# Patient Record
Sex: Male | Born: 1960 | Race: White | Hispanic: No | Marital: Married | Smoking: Never smoker
Health system: Southern US, Community
[De-identification: ages and names within clinical notes are randomized; demographics above are authoritative.]

## PROBLEM LIST (undated history)

## (undated) DIAGNOSIS — I517 Cardiomegaly: Secondary | ICD-10-CM

## (undated) DIAGNOSIS — M5126 Other intervertebral disc displacement, lumbar region: Secondary | ICD-10-CM

## (undated) DIAGNOSIS — Q231 Congenital insufficiency of aortic valve: Secondary | ICD-10-CM

## (undated) DIAGNOSIS — Q2381 Bicuspid aortic valve: Secondary | ICD-10-CM

## (undated) DIAGNOSIS — Z952 Presence of prosthetic heart valve: Secondary | ICD-10-CM

## (undated) DIAGNOSIS — I10 Essential (primary) hypertension: Secondary | ICD-10-CM

## (undated) DIAGNOSIS — E119 Type 2 diabetes mellitus without complications: Secondary | ICD-10-CM

## (undated) DIAGNOSIS — E785 Hyperlipidemia, unspecified: Secondary | ICD-10-CM

## (undated) DIAGNOSIS — M51369 Other intervertebral disc degeneration, lumbar region without mention of lumbar back pain or lower extremity pain: Secondary | ICD-10-CM

## (undated) DIAGNOSIS — I350 Nonrheumatic aortic (valve) stenosis: Secondary | ICD-10-CM

## (undated) DIAGNOSIS — I77810 Thoracic aortic ectasia: Secondary | ICD-10-CM

## (undated) DIAGNOSIS — K219 Gastro-esophageal reflux disease without esophagitis: Secondary | ICD-10-CM

## (undated) DIAGNOSIS — I2510 Atherosclerotic heart disease of native coronary artery without angina pectoris: Secondary | ICD-10-CM

## (undated) DIAGNOSIS — Z7982 Long term (current) use of aspirin: Secondary | ICD-10-CM

## (undated) DIAGNOSIS — I2089 Other forms of angina pectoris: Secondary | ICD-10-CM

## (undated) HISTORY — DX: Atherosclerotic heart disease of native coronary artery without angina pectoris: I25.10

## (undated) HISTORY — DX: Bicuspid aortic valve: Q23.81

## (undated) HISTORY — DX: Congenital insufficiency of aortic valve: Q23.1

## (undated) HISTORY — PX: OTHER SURGICAL HISTORY: SHX169

## (undated) HISTORY — PX: SHOULDER SURGERY: SHX246

## (undated) HISTORY — PX: NECK SURGERY: SHX720

## (undated) HISTORY — DX: Other forms of angina pectoris: I20.89

## (undated) HISTORY — DX: Hyperlipidemia, unspecified: E78.5

## (undated) HISTORY — DX: Cardiomegaly: I51.7

## (undated) HISTORY — DX: Nonrheumatic aortic (valve) stenosis: I35.0

## (undated) HISTORY — PX: COLONOSCOPY: SHX174

---

## 2000-11-26 ENCOUNTER — Encounter: Payer: Self-pay | Admitting: Neurosurgery

## 2000-11-30 ENCOUNTER — Encounter: Payer: Self-pay | Admitting: Neurosurgery

## 2000-11-30 ENCOUNTER — Ambulatory Visit (HOSPITAL_COMMUNITY): Admission: RE | Admit: 2000-11-30 | Discharge: 2000-12-01 | Payer: Self-pay | Admitting: Neurosurgery

## 2006-10-25 ENCOUNTER — Ambulatory Visit: Payer: Self-pay | Admitting: Urology

## 2008-03-04 ENCOUNTER — Ambulatory Visit: Payer: Self-pay | Admitting: Family Medicine

## 2008-03-05 ENCOUNTER — Ambulatory Visit: Payer: Self-pay | Admitting: Orthopedic Surgery

## 2008-11-15 ENCOUNTER — Emergency Department: Payer: Self-pay | Admitting: Emergency Medicine

## 2008-11-19 ENCOUNTER — Ambulatory Visit: Payer: Self-pay | Admitting: Urology

## 2008-11-26 ENCOUNTER — Ambulatory Visit: Payer: Self-pay | Admitting: Urology

## 2010-12-08 ENCOUNTER — Ambulatory Visit: Payer: Self-pay | Admitting: Gastroenterology

## 2011-06-11 IMAGING — CT CT STONE STUDY
1 of 2 series · 15 of 32 positions shown, 19 images · non-contrast
Comparison: none

REASON FOR EXAM: pain
COMMENTS:

[Series 2: stone · axial · 0.71mm/px · z∈[-398,+42]mm · 15 of 161 slices shown, 19 images]
[im 7/161  soft-tissue]
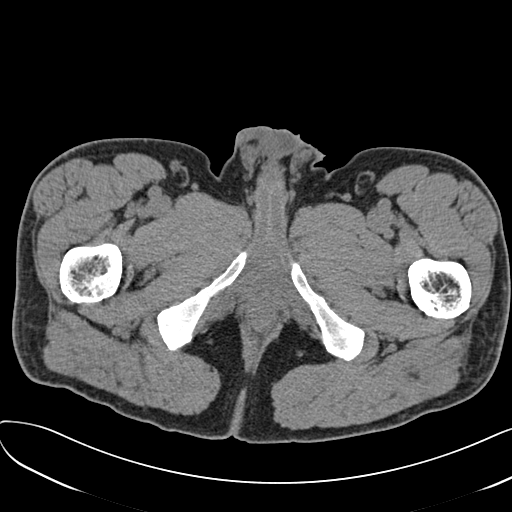
[im 7/161  bone]
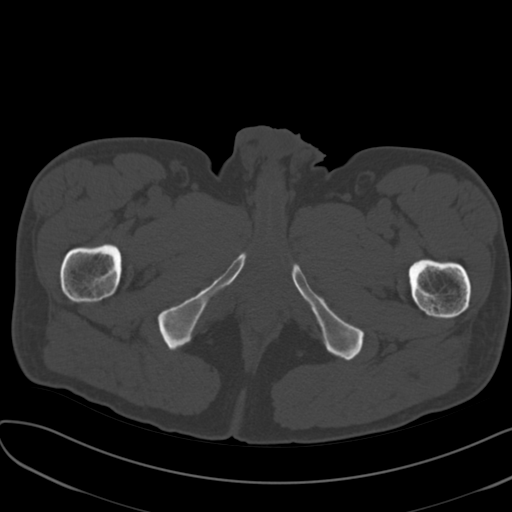
[im 19/161  soft-tissue]
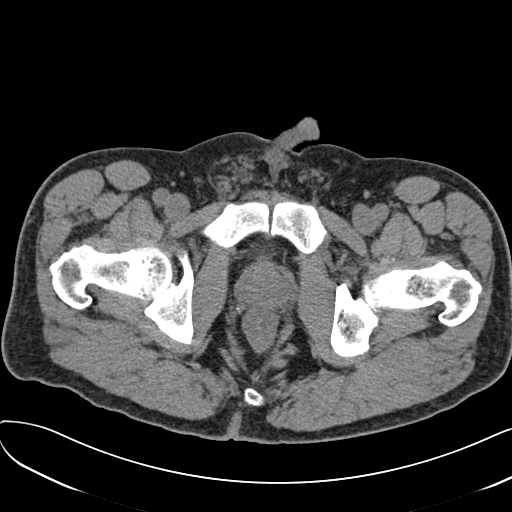
[im 31/161  soft-tissue]
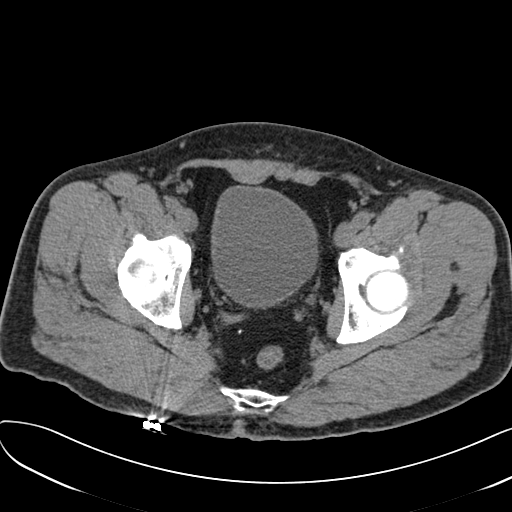
[im 44/161  soft-tissue]
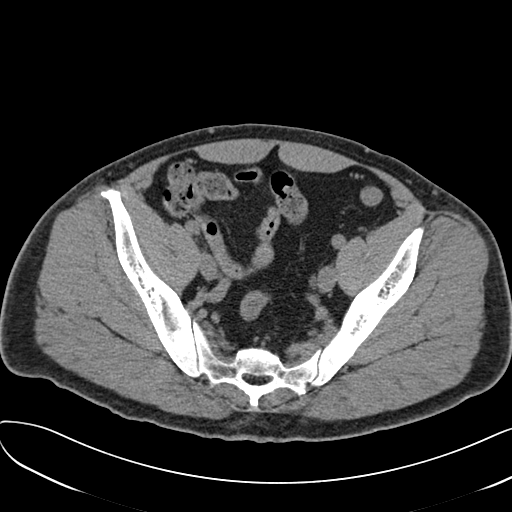
[im 56/161  soft-tissue]
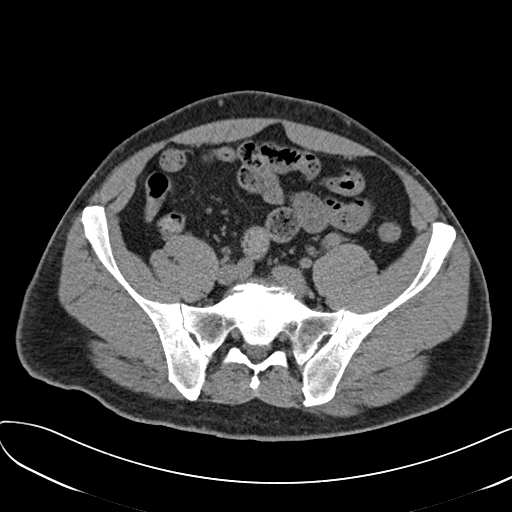
[im 68/161  soft-tissue]
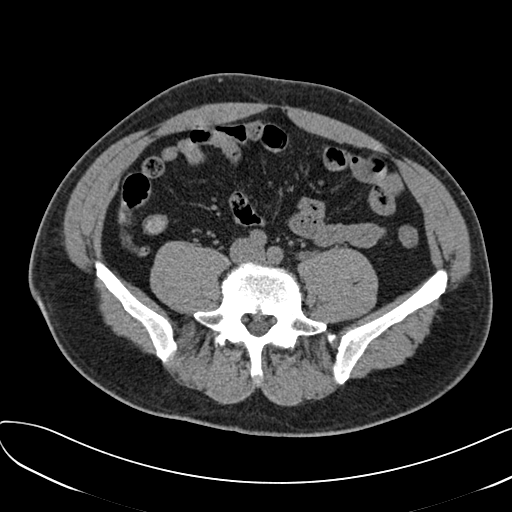
[im 81/161  soft-tissue]
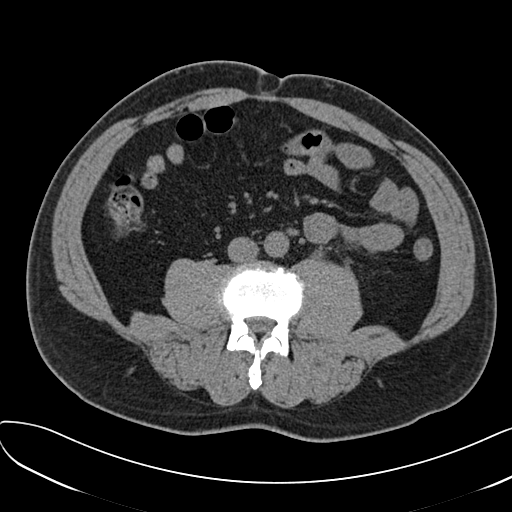
[im 93/161  soft-tissue]
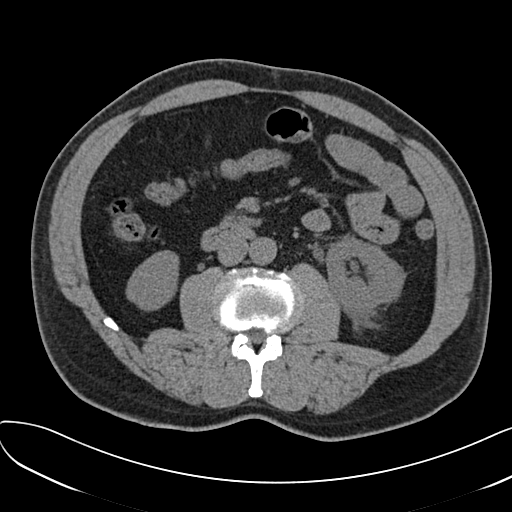
[im 105/161  soft-tissue]
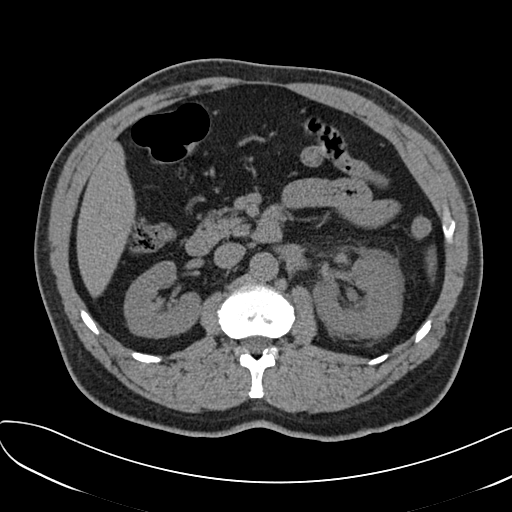
[im 105/161  bone]
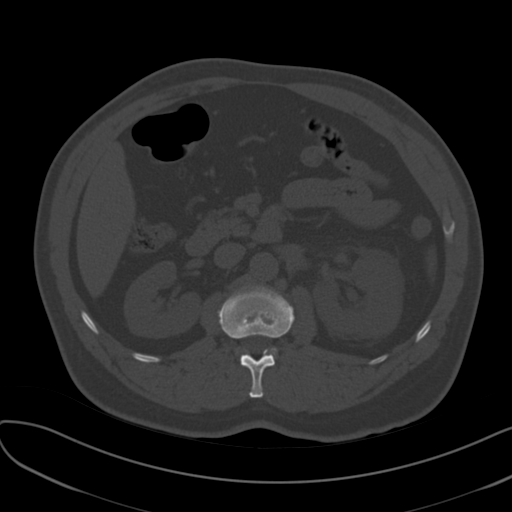
[im 117/161  soft-tissue]
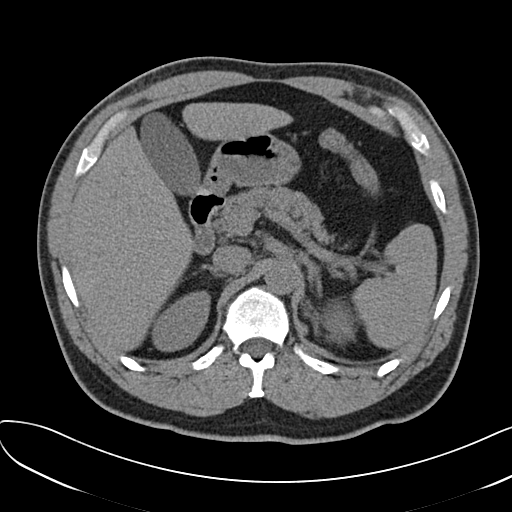
[im 130/161  soft-tissue]
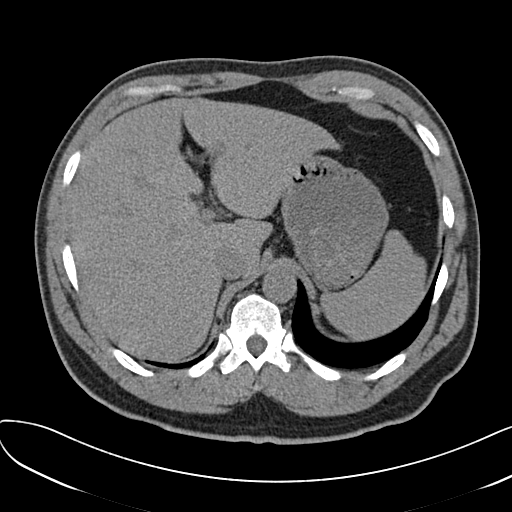
[im 136/161  lung]
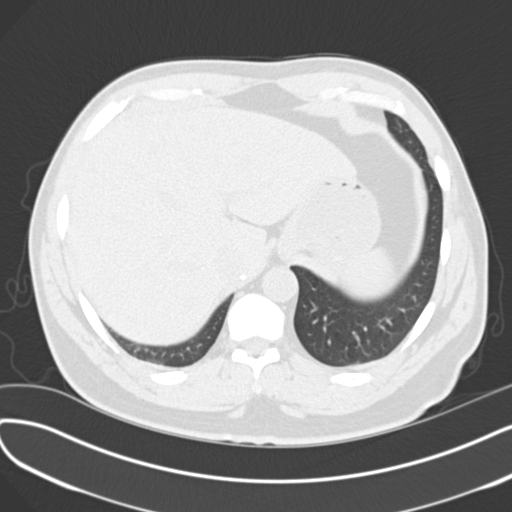
[im 142/161  soft-tissue]
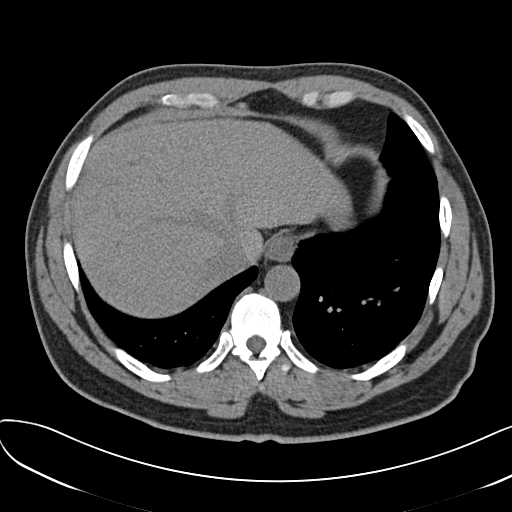
[im 142/161  lung]
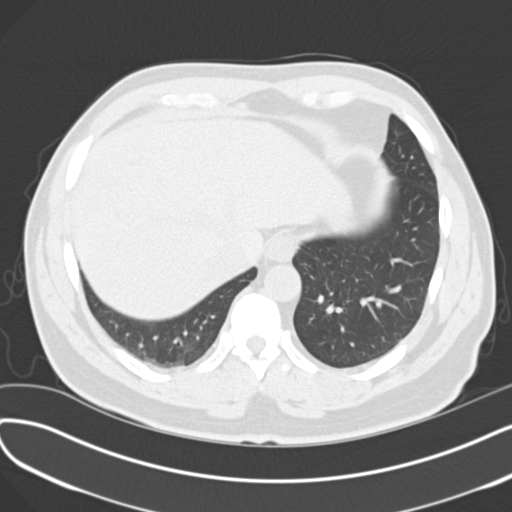
[im 148/161  lung]
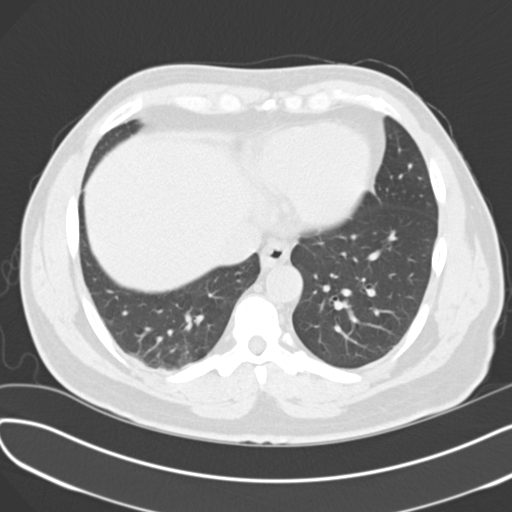
[im 154/161  soft-tissue]
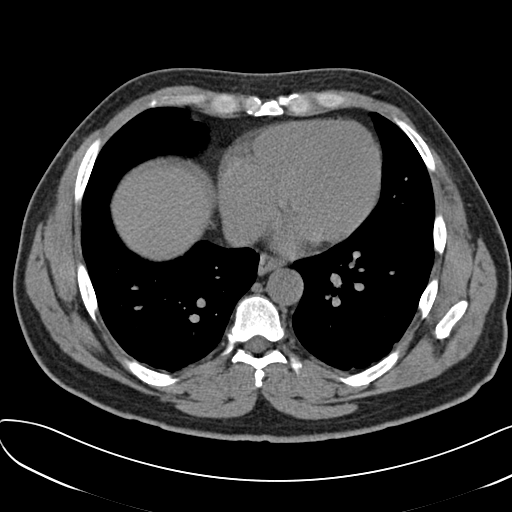
[im 154/161  lung]
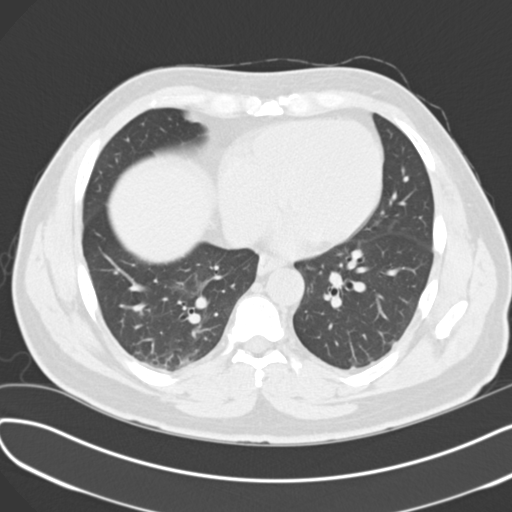

[15 of 32 positions shown; findings below may reference images not displayed]

PROCEDURE:     CT  - CT ABDOMEN /PELVIS WO (STONE)  - November 15, 2008  [DATE]

RESULT:     Axial noncontrast CT scanning was performed through the abdomen
and pelvis at 3 mm intervals and slice thicknesses. Review of 3-dimensional
reconstructed images was performed separately on the [employer] monitor.

There is mild hydronephrosis and hydroureter on the left secondary to an
approximately 3 mm diameter stone that lies approximately 5 mm from the
ureterovesical junction. I do not see evidence of other stones in the left
ureter. There is a nonobstructing 2 mm diameter lower correction mid pole
stone on the left. The right kidney exhibits no evidence of stones.

The liver, gallbladder, stomach, spleen, adrenal glands, pancreas, and
abdominal aorta are normal in appearance. There is no free fluid in the
abdomen or pelvis. The unopacified loops of small and large bowel appear
normal. The appendix is identified and is normal. The prostate gland and
seminal vesicles appear normal for age. There is a tiny amount of air within
the anterior aspect of the urinary bladder on image 127. There are
degenerative changes of the lumbar spine The lung bases exhibit no acute
abnormality.
IMPRESSION: 1. There is mild hydronephrosis and hydroureter on the left secondary to an
approximately 4 mm diameter distal left ureteral stone lying just proximal
to the ureterovesical junction. There is a nonobstructing 2 mm diameter
midpole stone on the left as well. I see no stones in the right kidney or
right ureter. The urinary bladder contains a tiny amount of air anteriorly
on image 127. Correlation with the patient's urinalysis is needed . This air
could be due to infection but also could be related to recent
catheterization of the bladder if any.
2. I do not see acute abnormality elsewhere within the abdomen or pelvis.

A preliminary report was sent to the [HOSPITAL] the conclusion
of the study.

## 2011-06-15 IMAGING — CR DG ABDOMEN 1V
1 series · 2 of 2 positions shown · non-contrast
Comparison: none

REASON FOR EXAM: kidney stone  send films with pt
COMMENTS:

[Series 1: view not recorded · 0.17mm/px · 2 of 2 slices shown]
[im 1/2]
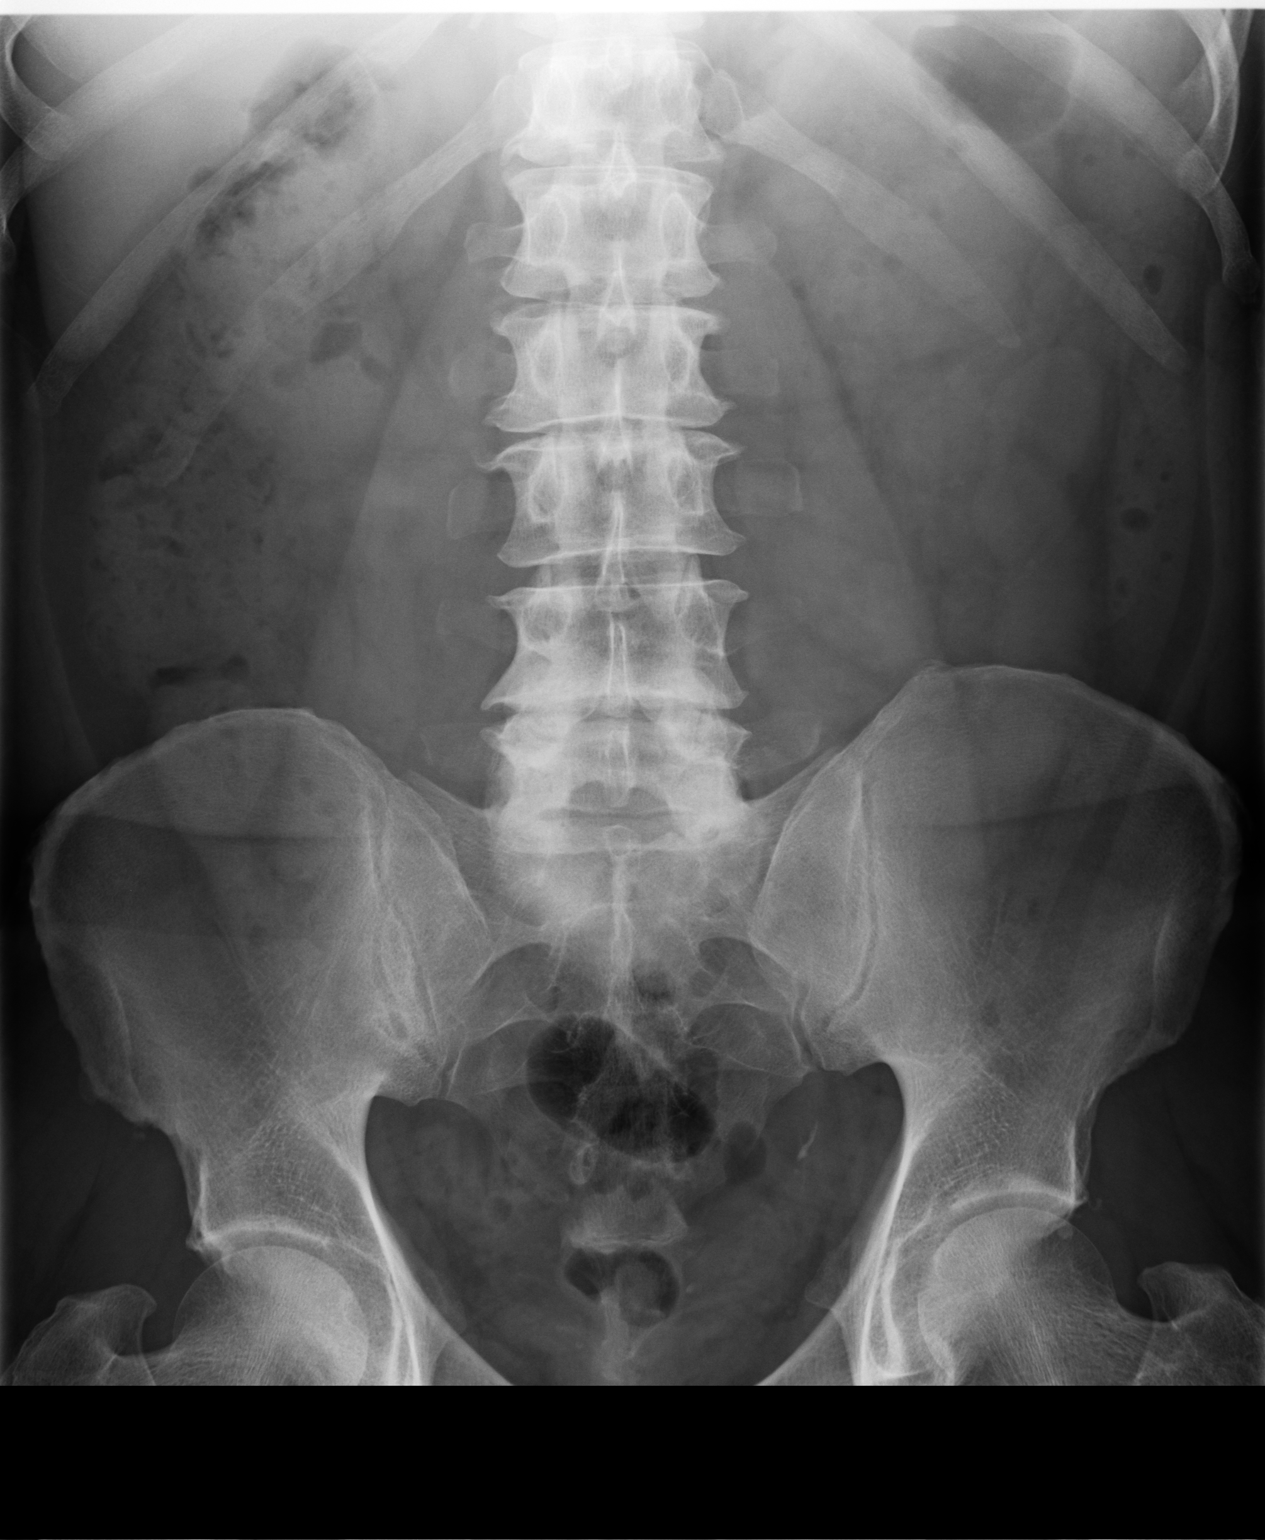
[im 2/2]
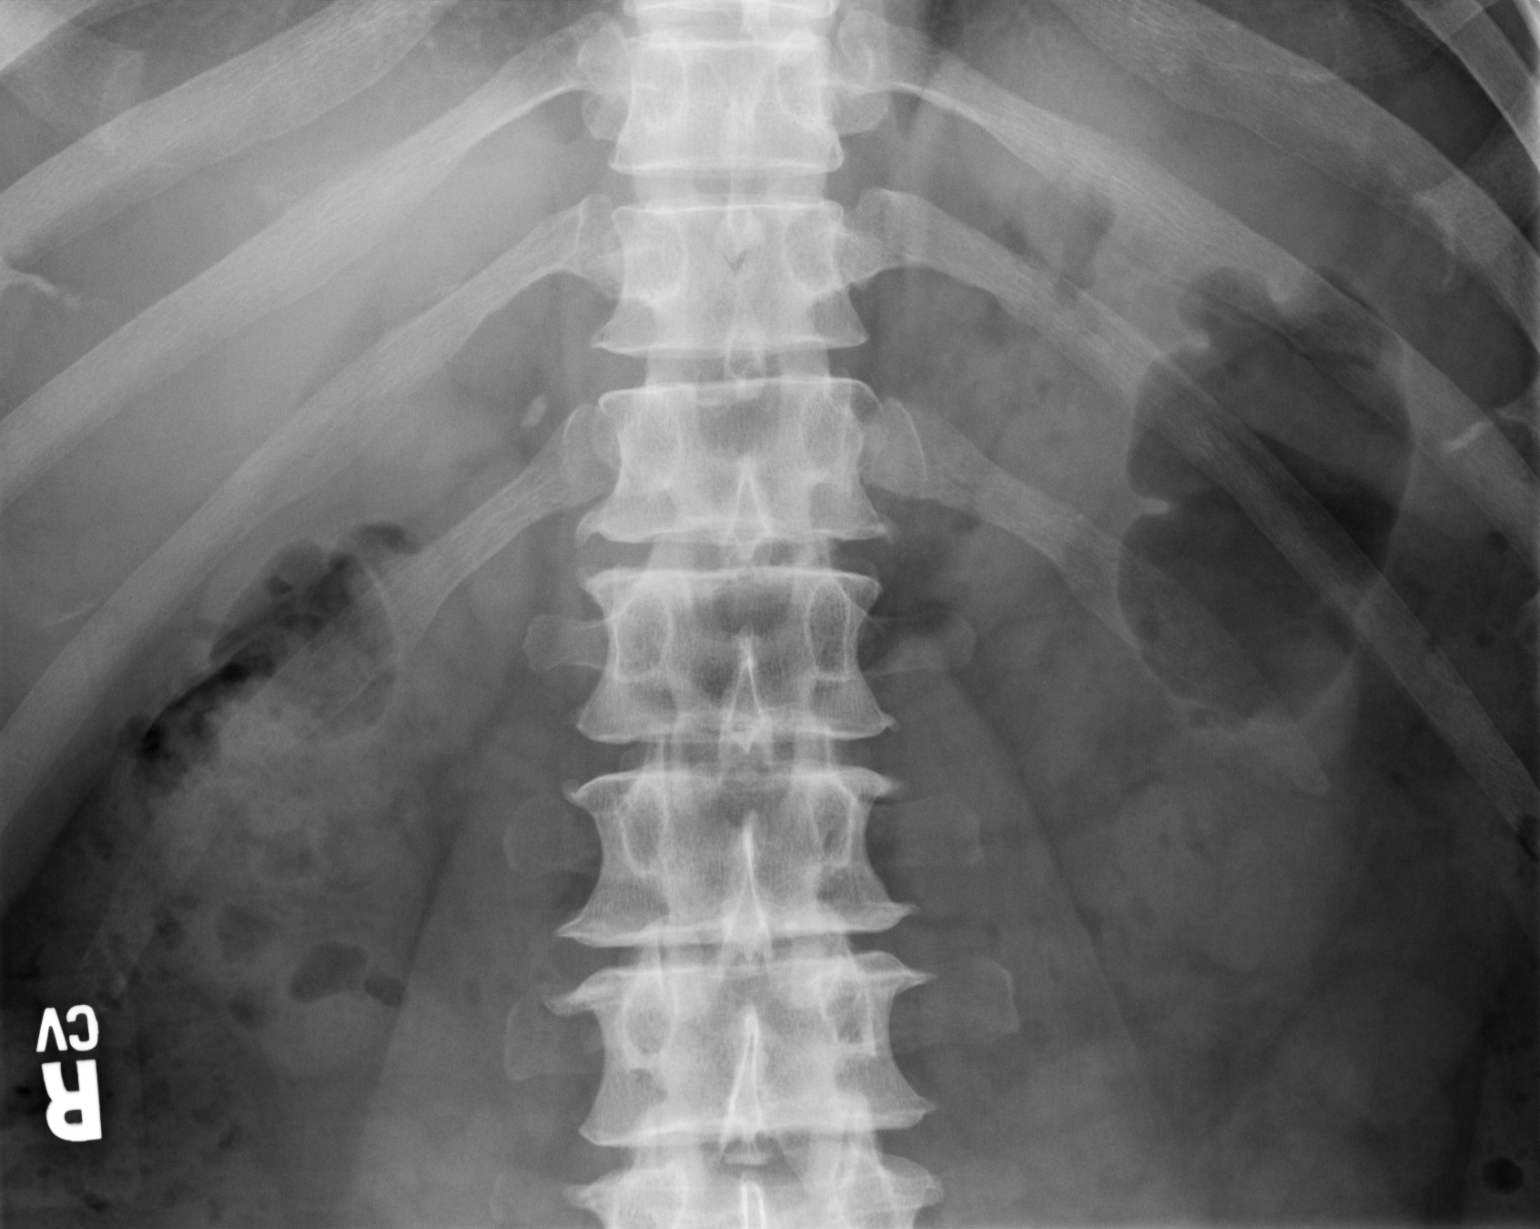

[2 of 2 positions shown; findings below may reference images not displayed]

PROCEDURE:     MDR - MDR KIDNEY URETER BLADDER  - November 19, 2008 [DATE]

RESULT:     The bowel gas pattern is within the limits of normal. There is
linear density noted in the left aspect of the true bony pelvis which
appears stable. The faint calcifications demonstrated previously over the
left kidney are not evident today. I see no calcified stones projecting over
the right kidney. Mild degenerative disc change of the lumbar spine is
present.
IMPRESSION: The previously demonstrated calcification projecting over
the midpole of the left kidney is not seen today. There is stable linear
density in the left aspect of the true bony pelvis. Further interpretation
is deferred to Dr. Moatshe.

## 2014-05-25 DIAGNOSIS — K219 Gastro-esophageal reflux disease without esophagitis: Secondary | ICD-10-CM | POA: Insufficient documentation

## 2016-11-18 DIAGNOSIS — H2511 Age-related nuclear cataract, right eye: Secondary | ICD-10-CM | POA: Diagnosis not present

## 2016-11-18 DIAGNOSIS — H2510 Age-related nuclear cataract, unspecified eye: Secondary | ICD-10-CM | POA: Diagnosis not present

## 2016-11-18 DIAGNOSIS — H52221 Regular astigmatism, right eye: Secondary | ICD-10-CM | POA: Diagnosis not present

## 2016-12-02 DIAGNOSIS — H52222 Regular astigmatism, left eye: Secondary | ICD-10-CM | POA: Diagnosis not present

## 2016-12-02 DIAGNOSIS — H2510 Age-related nuclear cataract, unspecified eye: Secondary | ICD-10-CM | POA: Diagnosis not present

## 2016-12-02 DIAGNOSIS — H2512 Age-related nuclear cataract, left eye: Secondary | ICD-10-CM | POA: Diagnosis not present

## 2017-01-27 DIAGNOSIS — E782 Mixed hyperlipidemia: Secondary | ICD-10-CM | POA: Diagnosis not present

## 2017-02-08 DIAGNOSIS — E785 Hyperlipidemia, unspecified: Secondary | ICD-10-CM | POA: Diagnosis not present

## 2017-02-08 DIAGNOSIS — M109 Gout, unspecified: Secondary | ICD-10-CM | POA: Diagnosis not present

## 2017-02-08 DIAGNOSIS — I10 Essential (primary) hypertension: Secondary | ICD-10-CM | POA: Diagnosis not present

## 2017-04-19 DIAGNOSIS — M109 Gout, unspecified: Secondary | ICD-10-CM | POA: Diagnosis not present

## 2017-04-19 DIAGNOSIS — E785 Hyperlipidemia, unspecified: Secondary | ICD-10-CM | POA: Diagnosis not present

## 2017-04-19 DIAGNOSIS — I10 Essential (primary) hypertension: Secondary | ICD-10-CM | POA: Diagnosis not present

## 2017-08-09 DIAGNOSIS — E785 Hyperlipidemia, unspecified: Secondary | ICD-10-CM | POA: Diagnosis not present

## 2017-08-09 DIAGNOSIS — Z0000 Encounter for general adult medical examination without abnormal findings: Secondary | ICD-10-CM | POA: Diagnosis not present

## 2017-08-09 DIAGNOSIS — I10 Essential (primary) hypertension: Secondary | ICD-10-CM | POA: Diagnosis not present

## 2017-09-22 DIAGNOSIS — R29898 Other symptoms and signs involving the musculoskeletal system: Secondary | ICD-10-CM | POA: Diagnosis not present

## 2018-02-11 DIAGNOSIS — K219 Gastro-esophageal reflux disease without esophagitis: Secondary | ICD-10-CM | POA: Diagnosis not present

## 2018-02-11 DIAGNOSIS — I10 Essential (primary) hypertension: Secondary | ICD-10-CM | POA: Diagnosis not present

## 2018-02-11 DIAGNOSIS — M109 Gout, unspecified: Secondary | ICD-10-CM | POA: Diagnosis not present

## 2018-02-25 DIAGNOSIS — M109 Gout, unspecified: Secondary | ICD-10-CM | POA: Diagnosis not present

## 2018-02-25 DIAGNOSIS — E785 Hyperlipidemia, unspecified: Secondary | ICD-10-CM | POA: Diagnosis not present

## 2018-02-25 DIAGNOSIS — I10 Essential (primary) hypertension: Secondary | ICD-10-CM | POA: Diagnosis not present

## 2018-03-16 DIAGNOSIS — I208 Other forms of angina pectoris: Secondary | ICD-10-CM | POA: Diagnosis not present

## 2018-03-16 DIAGNOSIS — I10 Essential (primary) hypertension: Secondary | ICD-10-CM | POA: Diagnosis not present

## 2018-04-06 DIAGNOSIS — I208 Other forms of angina pectoris: Secondary | ICD-10-CM | POA: Diagnosis not present

## 2018-04-21 DIAGNOSIS — I119 Hypertensive heart disease without heart failure: Secondary | ICD-10-CM | POA: Insufficient documentation

## 2018-04-21 DIAGNOSIS — I350 Nonrheumatic aortic (valve) stenosis: Secondary | ICD-10-CM | POA: Diagnosis not present

## 2018-04-21 DIAGNOSIS — I10 Essential (primary) hypertension: Secondary | ICD-10-CM | POA: Diagnosis not present

## 2018-04-21 DIAGNOSIS — E7801 Familial hypercholesterolemia: Secondary | ICD-10-CM | POA: Diagnosis not present

## 2018-06-29 DIAGNOSIS — I209 Angina pectoris, unspecified: Secondary | ICD-10-CM | POA: Diagnosis present

## 2018-07-01 ENCOUNTER — Other Ambulatory Visit
Admission: RE | Admit: 2018-07-01 | Discharge: 2018-07-01 | Disposition: A | Discharge status: Home / Self Care | Payer: 59 | Source: Ambulatory Visit | Attending: Internal Medicine | Admitting: Internal Medicine

## 2018-07-01 ENCOUNTER — Other Ambulatory Visit: Payer: Self-pay

## 2018-07-01 DIAGNOSIS — Z1159 Encounter for screening for other viral diseases: Secondary | ICD-10-CM | POA: Insufficient documentation

## 2018-07-02 LAB — NOVEL CORONAVIRUS, NAA (HOSP ORDER, SEND-OUT TO REF LAB; TAT 18-24 HRS): SARS-CoV-2, NAA: NOT DETECTED

## 2018-07-07 ENCOUNTER — Other Ambulatory Visit: Payer: Self-pay

## 2018-07-07 ENCOUNTER — Observation Stay
Admission: RE | Admit: 2018-07-07 | Discharge: 2018-07-08 | Disposition: A | Discharge status: Home / Self Care | Payer: 59 | Attending: Cardiology | Admitting: Cardiology

## 2018-07-07 ENCOUNTER — Encounter
Admission: RE | Disposition: A | Discharge status: Home / Self Care | Payer: Self-pay | Source: Home / Self Care | Attending: Cardiology

## 2018-07-07 DIAGNOSIS — Z79899 Other long term (current) drug therapy: Secondary | ICD-10-CM | POA: Insufficient documentation

## 2018-07-07 DIAGNOSIS — I209 Angina pectoris, unspecified: Secondary | ICD-10-CM | POA: Diagnosis present

## 2018-07-07 DIAGNOSIS — Z7982 Long term (current) use of aspirin: Secondary | ICD-10-CM | POA: Insufficient documentation

## 2018-07-07 DIAGNOSIS — I25119 Atherosclerotic heart disease of native coronary artery with unspecified angina pectoris: Principal | ICD-10-CM | POA: Insufficient documentation

## 2018-07-07 DIAGNOSIS — M109 Gout, unspecified: Secondary | ICD-10-CM

## 2018-07-07 DIAGNOSIS — I2510 Atherosclerotic heart disease of native coronary artery without angina pectoris: Secondary | ICD-10-CM | POA: Diagnosis present

## 2018-07-07 DIAGNOSIS — I10 Essential (primary) hypertension: Secondary | ICD-10-CM | POA: Insufficient documentation

## 2018-07-07 DIAGNOSIS — I350 Nonrheumatic aortic (valve) stenosis: Secondary | ICD-10-CM | POA: Insufficient documentation

## 2018-07-07 DIAGNOSIS — Z955 Presence of coronary angioplasty implant and graft: Secondary | ICD-10-CM | POA: Diagnosis not present

## 2018-07-07 DIAGNOSIS — Z791 Long term (current) use of non-steroidal anti-inflammatories (NSAID): Secondary | ICD-10-CM | POA: Insufficient documentation

## 2018-07-07 DIAGNOSIS — E782 Mixed hyperlipidemia: Secondary | ICD-10-CM | POA: Diagnosis not present

## 2018-07-07 DIAGNOSIS — R0789 Other chest pain: Secondary | ICD-10-CM

## 2018-07-07 HISTORY — PX: CORONARY STENT INTERVENTION: CATH118234

## 2018-07-07 HISTORY — DX: Gout, unspecified: M10.9

## 2018-07-07 HISTORY — PX: RIGHT/LEFT HEART CATH AND CORONARY ANGIOGRAPHY: CATH118266

## 2018-07-07 LAB — POCT ACTIVATED CLOTTING TIME: Activated Clotting Time: 368 seconds

## 2018-07-07 SURGERY — RIGHT/LEFT HEART CATH AND CORONARY ANGIOGRAPHY
Anesthesia: Moderate Sedation

## 2018-07-07 MED ORDER — LIDOCAINE HCL (PF) 1 % IJ SOLN
INTRAMUSCULAR | Status: AC
  Filled 2018-07-07: qty 30

## 2018-07-07 MED ORDER — SODIUM CHLORIDE 0.9 % WEIGHT BASED INFUSION
1.0000 mL/kg/h | INTRAVENOUS | Status: AC
  Administered 2018-07-07: 1 mL/kg/h via INTRAVENOUS

## 2018-07-07 MED ORDER — FENTANYL CITRATE (PF) 100 MCG/2ML IJ SOLN
INTRAMUSCULAR | Status: DC | PRN
  Administered 2018-07-07 (×2): 25 ug via INTRAVENOUS

## 2018-07-07 MED ORDER — HEPARIN (PORCINE) IN NACL 1000-0.9 UT/500ML-% IV SOLN
INTRAVENOUS | Status: AC
  Filled 2018-07-07: qty 1000

## 2018-07-07 MED ORDER — SODIUM CHLORIDE 0.9 % WEIGHT BASED INFUSION: 1.0000 mL/kg/h | INTRAVENOUS | Status: DC

## 2018-07-07 MED ORDER — SODIUM CHLORIDE 0.9 % WEIGHT BASED INFUSION
3.0000 mL/kg/h | INTRAVENOUS | Status: AC
  Administered 2018-07-07: 07:00:00 3 mL/kg/h via INTRAVENOUS

## 2018-07-07 MED ORDER — MIDAZOLAM HCL 2 MG/2ML IJ SOLN
INTRAMUSCULAR | Status: AC
  Filled 2018-07-07: qty 2

## 2018-07-07 MED ORDER — MIDAZOLAM HCL 2 MG/2ML IJ SOLN
INTRAMUSCULAR | Status: DC | PRN
  Administered 2018-07-07 (×2): 1 mg via INTRAVENOUS

## 2018-07-07 MED ORDER — ATROPINE SULFATE 1 MG/10ML IJ SOSY
PREFILLED_SYRINGE | INTRAMUSCULAR | Status: DC | PRN
  Administered 2018-07-07: 1 mg via INTRAVENOUS

## 2018-07-07 MED ORDER — BIVALIRUDIN TRIFLUOROACETATE 250 MG IV SOLR
INTRAVENOUS | Status: AC
  Filled 2018-07-07: qty 250

## 2018-07-07 MED ORDER — HYDROCHLOROTHIAZIDE 25 MG PO TABS
25.0000 mg | ORAL_TABLET | Freq: Every day | ORAL | Status: DC
  Administered 2018-07-08: 25 mg via ORAL
  Filled 2018-07-07 (×2): qty 1

## 2018-07-07 MED ORDER — ACETAMINOPHEN 325 MG PO TABS: 650.0000 mg | ORAL_TABLET | ORAL | Status: DC | PRN

## 2018-07-07 MED ORDER — SODIUM CHLORIDE 0.9 % IV SOLN: 250.0000 mL | INTRAVENOUS | Status: DC | PRN

## 2018-07-07 MED ORDER — ONDANSETRON HCL 4 MG/2ML IJ SOLN: 4.0000 mg | Freq: Four times a day (QID) | INTRAMUSCULAR | Status: DC | PRN

## 2018-07-07 MED ORDER — FENTANYL CITRATE (PF) 100 MCG/2ML IJ SOLN
INTRAMUSCULAR | Status: AC
  Filled 2018-07-07: qty 2

## 2018-07-07 MED ORDER — SODIUM CHLORIDE 0.9 % IV SOLN
INTRAVENOUS | Status: AC | PRN
  Administered 2018-07-07 (×2): 1.75 mg/kg/h via INTRAVENOUS

## 2018-07-07 MED ORDER — ASPIRIN 81 MG PO CHEW
81.0000 mg | CHEWABLE_TABLET | Freq: Every day | ORAL | Status: DC
  Administered 2018-07-08: 10:00:00 81 mg via ORAL
  Filled 2018-07-07: qty 1

## 2018-07-07 MED ORDER — HYDRALAZINE HCL 20 MG/ML IJ SOLN: 10.0000 mg | INTRAMUSCULAR | Status: AC | PRN

## 2018-07-07 MED ORDER — ATROPINE SULFATE 1 MG/10ML IJ SOSY
PREFILLED_SYRINGE | INTRAMUSCULAR | Status: AC
  Filled 2018-07-07: qty 10

## 2018-07-07 MED ORDER — LABETALOL HCL 5 MG/ML IV SOLN: 10.0000 mg | INTRAVENOUS | Status: AC | PRN

## 2018-07-07 MED ORDER — SODIUM CHLORIDE 0.9% FLUSH
3.0000 mL | Freq: Two times a day (BID) | INTRAVENOUS | Status: DC
  Administered 2018-07-07: 3 mL via INTRAVENOUS

## 2018-07-07 MED ORDER — ASPIRIN 81 MG PO CHEW: 81.0000 mg | CHEWABLE_TABLET | ORAL | Status: DC

## 2018-07-07 MED ORDER — NITROGLYCERIN 1 MG/10 ML FOR IR/CATH LAB
INTRA_ARTERIAL | Status: AC
  Filled 2018-07-07: qty 10

## 2018-07-07 MED ORDER — TICAGRELOR 90 MG PO TABS
ORAL_TABLET | ORAL | Status: AC
  Filled 2018-07-07: qty 2

## 2018-07-07 MED ORDER — IOHEXOL 300 MG/ML  SOLN
INTRAMUSCULAR | Status: DC | PRN
  Administered 2018-07-07: 75 mL via INTRA_ARTERIAL

## 2018-07-07 MED ORDER — SODIUM CHLORIDE 0.9% FLUSH: 3.0000 mL | INTRAVENOUS | Status: DC | PRN

## 2018-07-07 MED ORDER — TICAGRELOR 90 MG PO TABS
ORAL_TABLET | ORAL | Status: DC | PRN
  Administered 2018-07-07: 180 mg via ORAL

## 2018-07-07 MED ORDER — TICAGRELOR 90 MG PO TABS
90.0000 mg | ORAL_TABLET | Freq: Two times a day (BID) | ORAL | Status: DC
  Administered 2018-07-07 – 2018-07-08 (×2): 90 mg via ORAL
  Filled 2018-07-07 (×2): qty 1

## 2018-07-07 MED ORDER — ASPIRIN 81 MG PO CHEW
CHEWABLE_TABLET | ORAL | Status: DC | PRN
  Administered 2018-07-07: 324 mg via ORAL

## 2018-07-07 MED ORDER — NITROGLYCERIN 1 MG/10 ML FOR IR/CATH LAB
INTRA_ARTERIAL | Status: DC | PRN
  Administered 2018-07-07 (×2): 200 ug via INTRACORONARY

## 2018-07-07 MED ORDER — LISINOPRIL 20 MG PO TABS
20.0000 mg | ORAL_TABLET | Freq: Every day | ORAL | Status: DC
  Administered 2018-07-08: 20 mg via ORAL
  Filled 2018-07-07 (×2): qty 1

## 2018-07-07 MED ORDER — ATORVASTATIN CALCIUM 20 MG PO TABS: 40.0000 mg | ORAL_TABLET | Freq: Every day | ORAL | Status: DC

## 2018-07-07 MED ORDER — HEPARIN (PORCINE) IN NACL 1000-0.9 UT/500ML-% IV SOLN
INTRAVENOUS | Status: DC | PRN
  Administered 2018-07-07 (×2): 500 mL

## 2018-07-07 MED ORDER — ASPIRIN 81 MG PO CHEW
CHEWABLE_TABLET | ORAL | Status: AC
  Filled 2018-07-07: qty 4

## 2018-07-07 MED ORDER — ISOSORBIDE MONONITRATE ER 30 MG PO TB24
30.0000 mg | ORAL_TABLET | Freq: Every day | ORAL | Status: DC
  Administered 2018-07-08: 30 mg via ORAL
  Filled 2018-07-07 (×2): qty 1

## 2018-07-07 MED ORDER — BIVALIRUDIN BOLUS VIA INFUSION - CUPID
INTRAVENOUS | Status: DC | PRN
  Administered 2018-07-07: 61.95 mg via INTRAVENOUS

## 2018-07-07 MED ORDER — IOHEXOL 300 MG/ML  SOLN
INTRAMUSCULAR | Status: DC | PRN
  Administered 2018-07-07: 200 mL via INTRA_ARTERIAL

## 2018-07-07 SURGICAL SUPPLY — 27 items
BALLN TREK RX 2.25X20 (BALLOONS) ×2
BALLN ~~LOC~~ EUPHORA RX 2.5X20 (BALLOONS) ×2
BALLN ~~LOC~~ TREK RX 2.5X20 (BALLOONS) ×2
BALLOON TREK RX 2.25X20 (BALLOONS) ×1 IMPLANT
BALLOON ~~LOC~~ EUPHORA RX 2.5X20 (BALLOONS) ×1 IMPLANT
BALLOON ~~LOC~~ TREK RX 2.5X20 (BALLOONS) ×1 IMPLANT
CATH INFINITI 5FR JL4 (CATHETERS) ×2 IMPLANT
CATH INFINITI JR4 5F (CATHETERS) ×2 IMPLANT
CATH LANGSTON DUAL LUM PIG 6FR (CATHETERS) ×2 IMPLANT
CATH LAUNCHER 6FR JR4 (CATHETERS) ×2 IMPLANT
CATH SWANZ 7F THERMO (CATHETERS) ×2 IMPLANT
DEVICE CLOSURE MYNXGRIP 6/7F (Vascular Products) ×4 IMPLANT
DEVICE INFLAT 30 PLUS (MISCELLANEOUS) ×2 IMPLANT
GUIDELINER 6F (CATHETERS) ×2 IMPLANT
KIT MANI 3VAL PERCEP (MISCELLANEOUS) ×2 IMPLANT
KIT RIGHT HEART (MISCELLANEOUS) ×2 IMPLANT
NEEDLE PERC 18GX7CM (NEEDLE) ×2 IMPLANT
PACK CARDIAC CATH (CUSTOM PROCEDURE TRAY) ×2 IMPLANT
SHEATH AVANTI 6FR X 11CM (SHEATH) ×2 IMPLANT
SHEATH AVANTI 7FRX11 (SHEATH) ×2 IMPLANT
STENT SYNERGY DES 2.25X32 (Permanent Stent) ×2 IMPLANT
STENT SYNERGY DES 3X8 (Permanent Stent) IMPLANT
WIRE ASAHI PROWATER 180CM (WIRE) ×6 IMPLANT
WIRE EMERALD 3MM-J .035X260CM (WIRE) ×2 IMPLANT
WIRE EMERALD ST .035X150CM (WIRE) ×2 IMPLANT
WIRE G HI TQ BMW 190 (WIRE) ×2 IMPLANT
WIRE GUIDERIGHT .035X150 (WIRE) ×2 IMPLANT

## 2018-07-07 NOTE — Plan of Care (Signed)
Right groin site at Level 0.

## 2018-07-07 NOTE — Progress Notes (Signed)
Cardiac Rehab Navigator/ Exercise Physiologist Note  Angioplasty and Stent booklet given and reviewed with patient. Discussed the definition of CAD. Reviewed the location of CAD and where his stent was placed. Informed patient he  will be given a stent card. Explained the purpose of the stent card. Instructed patient to keep stent card in his wallet.  Patient reports that leading up to his Cath he had chest pain an called Dr. Gwen Pounds. Patient passed a stress test. Pain continued. 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 Patient had a repeat stress test and did not pas, which led to his Cath this morning and Stent placement.   Discussed modifiable risk factors including controlling blood pressure, cholesterol, and blood sugar; following heart healthy diet; maintaining healthy weight; exercise; and smoking cessation, not applicable.  Discussed cardiac medications including rationale for taking, mechanisms of action, and side effects. Stressed the importance of taking medications as prescribed.  Discussed emergency plan for heart attack symptoms. Patient verbalized understanding of need to call 911 and not to drive himself to ER if having cardiac symptoms / chest pain.   Diet of low sodium, low fat, low cholesterol heart healthy. Information on diet provided.   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 Patient would like to find other work.   Smoking Cessation - Patient is a NEVER smoker.   Exercise - Benefits of exercised discussed. Patient reports being active but does not exercise. Patient enjoys yard work and English as a second language teacher. This EP informed patient his cardiologist has referred him to outpatient Cardiac Rehab. An overview of the program was provided. Brochure, informational letter, class and orientation times, and CPT billing codes given to patient. Patient is interested in participating. Patient plans to check with his insurance  company to see what his out-of-pocket expenses will be. Patient informed the Cardiac Rehab department is currently closed due to the COVID-19 pandemic. The Cardiac Rehab dept will contact patient as soon as the department reopens. Patient would benefit from [employer] may have a financial barrier with insurance plan.   Patient appreciative of the information.  Nelson Chimes, BS [place]  [facility] Cardiac & Pulmonary Rehab  Exercise Physiologist Department Phone #: [phone] Fax: [phone]  Direct Line [phone] Email Address: [email or web address]

## 2018-07-08 ENCOUNTER — Encounter: Payer: Self-pay | Admitting: Internal Medicine

## 2018-07-08 DIAGNOSIS — I25119 Atherosclerotic heart disease of native coronary artery with unspecified angina pectoris: Secondary | ICD-10-CM | POA: Diagnosis not present

## 2018-07-08 LAB — CBC
HCT: 41.8 % (ref 39.0–52.0)
Hemoglobin: 14.1 g/dL (ref 13.0–17.0)
MCH: 29.5 pg (ref 26.0–34.0)
MCHC: 33.7 g/dL (ref 30.0–36.0)
MCV: 87.4 fL (ref 80.0–100.0)
Platelets: 163 10*3/uL (ref 150–400)
RBC: 4.78 MIL/uL (ref 4.22–5.81)
RDW: 13.1 % (ref 11.5–15.5)
WBC: 13.9 10*3/uL — ABNORMAL HIGH (ref 4.0–10.5)
nRBC: 0 % (ref 0.0–0.2)

## 2018-07-08 LAB — BASIC METABOLIC PANEL
Anion gap: 7 (ref 5–15)
BUN: 22 mg/dL — ABNORMAL HIGH (ref 6–20)
CO2: 27 mmol/L (ref 22–32)
Calcium: 9.3 mg/dL (ref 8.9–10.3)
Chloride: 106 mmol/L (ref 98–111)
Creatinine, Ser: 1.05 mg/dL (ref 0.61–1.24)
GFR calc Af Amer: >60 mL/min (ref >60)
GFR calc non Af Amer: >60 mL/min (ref >60)
Glucose, Bld: 127 mg/dL — ABNORMAL HIGH (ref 70–99)
Potassium: 4.1 mmol/L (ref 3.5–5.1)
Sodium: 140 mmol/L (ref 135–145)

## 2018-07-08 MED ORDER — TICAGRELOR 90 MG PO TABS: 90.0000 mg | ORAL_TABLET | Freq: Two times a day (BID) | ORAL | 11 refills | Status: DC

## 2018-07-08 MED ORDER — ASPIRIN 81 MG PO CHEW: 81.0000 mg | CHEWABLE_TABLET | Freq: Every day | ORAL | 4 refills | Status: AC

## 2018-07-08 MED ORDER — HYDROCHLOROTHIAZIDE 25 MG PO TABS: 25.0000 mg | ORAL_TABLET | Freq: Every day | ORAL | 4 refills | Status: DC

## 2018-07-08 NOTE — Discharge Summary (Signed)
[facility] Cardiology Discharge Summary  Patient ID: Carlos Lopez MRN: [id number] DOB/AGE: 1961/01/30 58 y.o.  Admit date: 07/07/2018 Discharge date: 07/08/2018  Primary Discharge Diagnosis: Ischemic chest pain I20.9 Secondary Discharge Diagnosis high blood pressure and high cholesterol  Significant Diagnostic Studies: Cardiac cath with left ventricular angiogram and selective coronary injection as well as PCI and stent placement of right coronary artery.  Hospital Course: The patient was admitted to specials for cardiac cath with selective coronary angiogram after full consent, risk and benefits explained, and time out called with all approprate details voiced and discussed. The patient has had progressive canadian class 3 angina with high probability risk stress test consistent with ischemic chest pain and or anginal equivalent with coronary artery risk factors including high blood pressure and high cholesterol. The procedure was performed without complication and it revealed normal left ventricular function with ejection fraction of 60%.  It was found that the patient had severe 1 vessel coronary atherosclerosis with significant right coronary artery stenosis requiring further intervention. Therefore, the patient had a PCI and drug eluding stent placed without complication. The patient has been ambulating without further significant symptoms and has reached his maximal hospital benefit and will be discharged to home in good condition.  Cardiac rehabilitation has been discussed and recommended. Medication management of cardiovascular risk factors will be given post discharge and modified as an outpatient.   Discharge Exam: Blood pressure (!) 137/96, pulse 62, temperature 97.9 F (36.6 C), temperature source Oral, resp. rate 20, height 5\' 10"  (1.778 m), weight 80.8 kg, SpO2 100 %.  Constitutional: Alet oriented to person, place, and time. No distress.  HENT: No nasal discharge.   Head: Normocephalic and atraumatic.  Eyes: Pupils are equal and round. No discharge.  Neck: Normal range of motion. Neck supple. No JVD present. No thyromegaly present.  Cardiovascular: Normal rate, regular rhythm, normal S1 S2, no gallop, no friction rub. No murmur Pulmonary/Chest: Effort normal, No stridor. No respiratory distress. no wheezes.  no rales.    Abdominal: Soft. Bowel sounds are normal.  no distension.  no tenderness. There is no rebound and no guarding.  Musculoskeletal: No edema, no cyanosis, normal pulses, no bleeding, Normal range of motion. no tenderness.  Neurological:  alert and oriented to person, place, and time. Coordination normal.  Skin: Skin is warm and dry. No rash noted. No erythema. No pallor.  Psychiatric:  normal mood and affect. behavior is normal.    Labs:   Lab Results  Component Value Date   WBC 13.9 (H) 07/08/2018   HGB 14.1 07/08/2018   HCT 41.8 07/08/2018   MCV 87.4 07/08/2018   PLT 163 07/08/2018    Recent Labs  Lab 07/08/18 0259  NA 140  K 4.1  CL 106  CO2 27  BUN 22*  CREATININE 1.05  CALCIUM 9.3  GLUCOSE 127*    EKG: NSR without evidence of new changes  FOLLOW UP IN ONE TO TWO WEEKS Discharge Instructions    AMB Referral to Cardiac Rehabilitation - Phase II   Complete by:  As directed    Diagnosis:  Coronary Stents   After initial evaluation and assessments completed: Virtual Based Care may be provided alone or in conjunction with Phase 2 Cardiac Rehab based on patient barriers.:  Yes     Allergies as of 07/08/2018   No Known Allergies     Medication List    STOP taking these medications   isosorbide mononitrate 30 MG 24 hr tablet  Commonly known as:  IMDUR   naproxen 500 MG tablet Commonly known as:  NAPROSYN     TAKE these medications   allopurinol 300 MG tablet Commonly known as:  ZYLOPRIM Take 300 mg by mouth daily.   aspirin 81 MG chewable tablet Chew 1 tablet (81 mg total) by mouth daily.    atorvastatin 40 MG tablet Commonly known as:  LIPITOR Take 40 mg by mouth daily.   hydrochlorothiazide 25 MG tablet Commonly known as:  HYDRODIURIL Take 1 tablet (25 mg total) by mouth daily.   lisinopril-hydrochlorothiazide 20-25 MG tablet Commonly known as:  ZESTORETIC Take 1 tablet by mouth daily.   omeprazole 20 MG capsule Commonly known as:  PRILOSEC Take 20 mg by mouth daily before breakfast.   ticagrelor 90 MG Tabs tablet Commonly known as:  BRILINTA Take 1 tablet (90 mg total) by mouth 2 (two) times daily.      Follow-up Information    [facility] Cardiac and Pulmonary Rehab Follow up.   Specialty:  Cardiac Rehabilitation Why:  Your Cardiologist has referred you to Cardiac Rehab. The Dept. is currently closed due to COVID-19. We will be offering Virtual Rehab soon. The Dept. will contact you within 1-2 weeks. Please feel free to call with any questions. Contact information: [id number] [place] [address] [phone]          THE PATIENT  SHALL BRING ALL MEDICATIONS TO FOLLOW UP APPOINTMENT  Signed:  Lamar Blinks MD, [facility] 07/08/2018, 8:25 AM

## 2018-07-08 NOTE — Progress Notes (Signed)
Patient's discharge medication included HCTZ and HCTZ lisinopril combination. Paged Dr. Gwen Pounds with no response. Spoke with Dr. Cassie Freer and stated for patient to only take HCTZ lisinopril combination at this time. Patient gave instructions on new discharge instructions. Patient did not report any questions, discharged home. Gery Pray

## 2018-10-12 ENCOUNTER — Other Ambulatory Visit: Payer: Self-pay | Admitting: Internal Medicine

## 2018-10-12 DIAGNOSIS — Z20822 Contact with and (suspected) exposure to covid-19: Secondary | ICD-10-CM

## 2018-10-13 LAB — NOVEL CORONAVIRUS, NAA: SARS-CoV-2, NAA: NOT DETECTED

## 2020-01-12 ENCOUNTER — Other Ambulatory Visit: Payer: Self-pay | Admitting: Cardiology

## 2020-01-12 DIAGNOSIS — Q231 Congenital insufficiency of aortic valve: Secondary | ICD-10-CM

## 2020-01-23 ENCOUNTER — Other Ambulatory Visit: Payer: Self-pay

## 2020-01-23 ENCOUNTER — Other Ambulatory Visit
Admission: RE | Admit: 2020-01-23 | Discharge: 2020-01-23 | Disposition: A | Discharge status: Home / Self Care | Payer: 59 | Source: Home / Self Care | Attending: Cardiology | Admitting: Cardiology

## 2020-01-23 ENCOUNTER — Ambulatory Visit
Admission: RE | Admit: 2020-01-23 | Discharge: 2020-01-23 | Disposition: A | Discharge status: Home / Self Care | Payer: 59 | Source: Ambulatory Visit | Attending: Cardiology | Admitting: Cardiology

## 2020-01-23 DIAGNOSIS — Q231 Congenital insufficiency of aortic valve: Secondary | ICD-10-CM | POA: Insufficient documentation

## 2020-01-23 DIAGNOSIS — I10 Essential (primary) hypertension: Secondary | ICD-10-CM | POA: Insufficient documentation

## 2020-01-23 LAB — CREATININE, SERUM
Creatinine, Ser: 0.89 mg/dL (ref 0.61–1.24)
GFR, Estimated: >60 mL/min (ref >60)

## 2020-01-23 MED ORDER — IOHEXOL 350 MG/ML SOLN
100.0000 mL | Freq: Once | INTRAVENOUS | Status: AC | PRN
  Administered 2020-01-23: 100 mL via INTRAVENOUS

## 2020-08-07 DIAGNOSIS — I208 Other forms of angina pectoris: Secondary | ICD-10-CM | POA: Diagnosis present

## 2020-08-14 ENCOUNTER — Other Ambulatory Visit: Payer: Self-pay

## 2020-08-14 ENCOUNTER — Encounter: Payer: Self-pay | Admitting: Internal Medicine

## 2020-08-14 ENCOUNTER — Ambulatory Visit
Admission: RE | Admit: 2020-08-14 | Discharge: 2020-08-14 | Disposition: A | Discharge status: Home / Self Care | Payer: 59 | Attending: Internal Medicine | Admitting: Internal Medicine

## 2020-08-14 ENCOUNTER — Encounter
Admission: RE | Disposition: A | Discharge status: Home / Self Care | Payer: Self-pay | Source: Home / Self Care | Attending: Internal Medicine

## 2020-08-14 DIAGNOSIS — I25119 Atherosclerotic heart disease of native coronary artery with unspecified angina pectoris: Secondary | ICD-10-CM | POA: Diagnosis not present

## 2020-08-14 DIAGNOSIS — E785 Hyperlipidemia, unspecified: Secondary | ICD-10-CM | POA: Diagnosis not present

## 2020-08-14 DIAGNOSIS — I10 Essential (primary) hypertension: Secondary | ICD-10-CM | POA: Diagnosis not present

## 2020-08-14 DIAGNOSIS — I208 Other forms of angina pectoris: Secondary | ICD-10-CM | POA: Diagnosis present

## 2020-08-14 DIAGNOSIS — R9430 Abnormal result of cardiovascular function study, unspecified: Secondary | ICD-10-CM | POA: Diagnosis present

## 2020-08-14 DIAGNOSIS — I350 Nonrheumatic aortic (valve) stenosis: Secondary | ICD-10-CM | POA: Diagnosis not present

## 2020-08-14 HISTORY — PX: RIGHT/LEFT HEART CATH AND CORONARY ANGIOGRAPHY: CATH118266

## 2020-08-14 HISTORY — DX: Essential (primary) hypertension: I10

## 2020-08-14 SURGERY — RIGHT/LEFT HEART CATH AND CORONARY ANGIOGRAPHY
Anesthesia: Moderate Sedation

## 2020-08-14 MED ORDER — LIDOCAINE HCL (PF) 1 % IJ SOLN
INTRAMUSCULAR | Status: AC
  Filled 2020-08-14: qty 30

## 2020-08-14 MED ORDER — SODIUM CHLORIDE 0.9% FLUSH: 3.0000 mL | Freq: Two times a day (BID) | INTRAVENOUS | Status: DC

## 2020-08-14 MED ORDER — ACETAMINOPHEN 325 MG PO TABS: 650.0000 mg | ORAL_TABLET | ORAL | Status: DC | PRN

## 2020-08-14 MED ORDER — FENTANYL CITRATE (PF) 100 MCG/2ML IJ SOLN
INTRAMUSCULAR | Status: DC | PRN
  Administered 2020-08-14: 50 ug via INTRAVENOUS

## 2020-08-14 MED ORDER — HYDRALAZINE HCL 20 MG/ML IJ SOLN: 10.0000 mg | INTRAMUSCULAR | Status: DC | PRN

## 2020-08-14 MED ORDER — ASPIRIN 81 MG PO CHEW: 81.0000 mg | CHEWABLE_TABLET | ORAL | Status: DC

## 2020-08-14 MED ORDER — SODIUM CHLORIDE 0.9 % IV SOLN: 250.0000 mL | INTRAVENOUS | Status: DC | PRN

## 2020-08-14 MED ORDER — HEPARIN (PORCINE) IN NACL 2000-0.9 UNIT/L-% IV SOLN
INTRAVENOUS | Status: DC | PRN
  Administered 2020-08-14: 1000 mL

## 2020-08-14 MED ORDER — ONDANSETRON HCL 4 MG/2ML IJ SOLN: 4.0000 mg | Freq: Four times a day (QID) | INTRAMUSCULAR | Status: DC | PRN

## 2020-08-14 MED ORDER — SODIUM CHLORIDE 0.9 % WEIGHT BASED INFUSION: 1.0000 mL/kg/h | INTRAVENOUS | Status: DC

## 2020-08-14 MED ORDER — MIDAZOLAM HCL 2 MG/2ML IJ SOLN
INTRAMUSCULAR | Status: AC
  Filled 2020-08-14: qty 2

## 2020-08-14 MED ORDER — LABETALOL HCL 5 MG/ML IV SOLN: 10.0000 mg | INTRAVENOUS | Status: DC | PRN

## 2020-08-14 MED ORDER — FENTANYL CITRATE (PF) 100 MCG/2ML IJ SOLN
INTRAMUSCULAR | Status: AC
  Filled 2020-08-14: qty 2

## 2020-08-14 MED ORDER — HEPARIN SODIUM (PORCINE) 1000 UNIT/ML IJ SOLN
INTRAMUSCULAR | Status: AC
  Filled 2020-08-14: qty 1

## 2020-08-14 MED ORDER — LIDOCAINE HCL (PF) 1 % IJ SOLN
INTRAMUSCULAR | Status: DC | PRN
Start: 2020-08-14 — End: 2020-08-14
  Administered 2020-08-14: 12 mL

## 2020-08-14 MED ORDER — SODIUM CHLORIDE 0.9% FLUSH: 3.0000 mL | INTRAVENOUS | Status: DC | PRN

## 2020-08-14 MED ORDER — SODIUM CHLORIDE 0.9 % WEIGHT BASED INFUSION
1.0000 mL/kg/h | INTRAVENOUS | Status: DC
  Administered 2020-08-14: 1 mL/kg/h via INTRAVENOUS

## 2020-08-14 MED ORDER — MIDAZOLAM HCL 2 MG/2ML IJ SOLN
INTRAMUSCULAR | Status: DC | PRN
  Administered 2020-08-14: 1 mg via INTRAVENOUS

## 2020-08-14 MED ORDER — IOHEXOL 300 MG/ML  SOLN
INTRAMUSCULAR | Status: DC | PRN
  Administered 2020-08-14: 100 mL

## 2020-08-14 MED ORDER — SODIUM CHLORIDE 0.9 % WEIGHT BASED INFUSION
3.0000 mL/kg/h | INTRAVENOUS | Status: AC
  Administered 2020-08-14: 3 mL/kg/h via INTRAVENOUS

## 2020-08-14 SURGICAL SUPPLY — 16 items
CATH INFINITI 5FR ANG PIGTAIL (CATHETERS) ×2 IMPLANT
CATH INFINITI 5FR JL4 (CATHETERS) ×2 IMPLANT
CATH INFINITI JR4 5F (CATHETERS) ×2 IMPLANT
CATH SWAN GANZ 7F STRAIGHT (CATHETERS) ×2 IMPLANT
DEVICE CLOSURE MYNXGRIP 6/7F (Vascular Products) ×2 IMPLANT
KIT RIGHT HEART (MISCELLANEOUS) ×2 IMPLANT
KIT SYRINGE INJ CVI SPIKEX1 (MISCELLANEOUS) ×2 IMPLANT
NEEDLE PERC 18GX7CM (NEEDLE) ×2 IMPLANT
PACK CARDIAC CATH (CUSTOM PROCEDURE TRAY) ×2 IMPLANT
PROTECTION STATION PRESSURIZED (MISCELLANEOUS) ×2
SET ATX SIMPLICITY (MISCELLANEOUS) ×2 IMPLANT
SHEATH AVANTI 6FR X 11CM (SHEATH) ×2 IMPLANT
SHEATH AVANTI 7FRX11 (SHEATH) ×2 IMPLANT
STATION PROTECTION PRESSURIZED (MISCELLANEOUS) ×1 IMPLANT
WIRE EMERALD ST .035X150CM (WIRE) ×2 IMPLANT
WIRE GUIDERIGHT .035X150 (WIRE) ×2 IMPLANT

## 2020-08-15 ENCOUNTER — Encounter: Payer: Self-pay | Admitting: Internal Medicine

## 2020-08-21 DIAGNOSIS — I77810 Thoracic aortic ectasia: Secondary | ICD-10-CM | POA: Insufficient documentation

## 2020-09-13 DIAGNOSIS — Z952 Presence of prosthetic heart valve: Secondary | ICD-10-CM

## 2020-09-13 HISTORY — DX: Presence of prosthetic heart valve: Z95.2

## 2020-09-13 HISTORY — PX: AORTIC VALVE REPLACEMENT: SHX41

## 2020-09-14 DIAGNOSIS — Z952 Presence of prosthetic heart valve: Secondary | ICD-10-CM | POA: Insufficient documentation

## 2020-10-04 ENCOUNTER — Encounter: Payer: 59 | Attending: Internal Medicine | Admitting: *Deleted

## 2020-10-04 ENCOUNTER — Other Ambulatory Visit: Payer: Self-pay

## 2020-10-04 DIAGNOSIS — E785 Hyperlipidemia, unspecified: Secondary | ICD-10-CM | POA: Insufficient documentation

## 2020-10-04 DIAGNOSIS — Z952 Presence of prosthetic heart valve: Secondary | ICD-10-CM

## 2020-10-04 DIAGNOSIS — I10 Essential (primary) hypertension: Secondary | ICD-10-CM | POA: Insufficient documentation

## 2020-10-04 NOTE — Progress Notes (Signed)
Initial telephone orientation completed. Diagnosis can be found in [facility] 8/4. EP orientation scheduled for Thursday 9/8 at 8am.

## 2020-10-07 ENCOUNTER — Ambulatory Visit: Payer: 59

## 2020-10-17 ENCOUNTER — Other Ambulatory Visit: Payer: Self-pay

## 2020-10-17 ENCOUNTER — Encounter: Payer: 59 | Attending: Internal Medicine

## 2020-10-17 VITALS — Ht 69.7 in | Wt 178.1 lb

## 2020-10-17 DIAGNOSIS — Z952 Presence of prosthetic heart valve: Secondary | ICD-10-CM | POA: Insufficient documentation

## 2020-10-17 NOTE — Progress Notes (Signed)
Cardiac Individual Treatment Plan  Patient Details  Name: Carlos Lopez MRN: [id number] Date of Birth: 01-17-1961 Referring Provider:   Flowsheet Row Cardiac Rehab from 10/17/2020 in [facility] Cardiac and Pulmonary Rehab  Referring Provider Arnoldo Hooker MD       Initial Encounter Date:  Flowsheet Row Cardiac Rehab from 10/17/2020 in [facility] Cardiac and Pulmonary Rehab  Date 10/17/20       Visit Diagnosis: S/P AVR (aortic valve replacement)  Patient's Home Medications on Admission:  Current Outpatient Medications:    allopurinol (ZYLOPRIM) 300 MG tablet, Take 300 mg by mouth daily., Disp: , Rfl:    aspirin 81 MG chewable tablet, Chew 1 tablet (81 mg total) by mouth daily., Disp: 90 tablet, Rfl: 4   atorvastatin (LIPITOR) 80 MG tablet, Take 80 mg by mouth daily., Disp: , Rfl:    Cyanocobalamin (B-12 PO), Take 1 tablet by mouth daily., Disp: , Rfl:    furosemide (LASIX) 40 MG tablet, Take by mouth., Disp: , Rfl:    losartan-hydrochlorothiazide (HYZAAR) 100-12.5 MG tablet, Take 1 tablet by mouth daily. (Patient not taking: Reported on 10/04/2020), Disp: , Rfl:    metoprolol tartrate (LOPRESSOR) 25 MG tablet, Take by mouth., Disp: , Rfl:    pantoprazole (PROTONIX) 40 MG tablet, Take 40 mg by mouth daily., Disp: , Rfl:    potassium chloride SA (KLOR-CON) 20 MEQ tablet, Take by mouth., Disp: , Rfl:   Past Medical History: Past Medical History:  Diagnosis Date   Gout 07/07/2018   Hypertension     Tobacco Use: Social History   Tobacco Use  Smoking Status Never  Smokeless Tobacco Former    Labs: Recent Review Flowsheet Data   There is no flowsheet data to display.      Exercise Target Goals: Exercise Program Goal: Individual exercise prescription set using results from initial 6 min walk test and THRR while considering  patient's activity barriers and safety.   Exercise Prescription Goal: Initial exercise prescription builds to 30-45 minutes a day of aerobic activity,  2-3 days per week.  Home exercise guidelines will be given to patient during program as part of exercise prescription that the participant will acknowledge.   Education: Aerobic Exercise: - Group verbal and visual presentation on the components of exercise prescription. Introduces F.I.T.T principle from ACSM for exercise prescriptions.  Reviews F.I.T.T. principles of aerobic exercise including progression. Written material given at graduation.   Education: Resistance Exercise: - Group verbal and visual presentation on the components of exercise prescription. Introduces F.I.T.T principle from ACSM for exercise prescriptions  Reviews F.I.T.T. principles of resistance exercise including progression. Written material given at graduation.    Education: Exercise & Equipment Safety: - Individual verbal instruction and demonstration of equipment use and safety with use of the equipment. Flowsheet Row Cardiac Rehab from 10/17/2020 in [facility] Cardiac and Pulmonary Rehab  Education need identified 10/17/20  Date 10/17/20  Educator KL  Instruction Review Code 1- Verbalizes Understanding       Education: Exercise Physiology & General Exercise Guidelines: - Group verbal and written instruction with models to review the exercise physiology of the cardiovascular system and associated critical values. Provides general exercise guidelines with specific guidelines to those with heart or lung disease.  Flowsheet Row Cardiac Rehab from 10/17/2020 in [facility] Cardiac and Pulmonary Rehab  Education need identified 10/17/20       Education: Flexibility, Balance, Mind/Body Relaxation: - Group verbal and visual presentation with interactive activity on the components of exercise prescription. Introduces F.I.T.T  principle from ACSM for exercise prescriptions. Reviews F.I.T.T. principles of flexibility and balance exercise training including progression. Also discusses the mind body connection.  Reviews various relaxation  techniques to help reduce and manage stress (i.e. Deep breathing, progressive muscle relaxation, and visualization). Balance handout provided to take home. Written material given at graduation.   Activity Barriers & Risk Stratification:  Activity Barriers & Cardiac Risk Stratification - 10/17/20 1147       Activity Barriers & Cardiac Risk Stratification   Activity Barriers Other (comment)    Comments Right knee- arthritis    Cardiac Risk Stratification Moderate             6 Minute Walk:  6 Minute Walk     Row Name 10/17/20 1151         6 Minute Walk   Phase Initial     Distance 1490 feet     Walk Time 6 minutes     # of Rest Breaks 0     MPH 2.82     METS 4.16     RPE 7     Perceived Dyspnea  0     VO2 Peak 14.56     Symptoms No     Resting HR 59 bpm     Resting BP 152/88     Resting Oxygen Saturation  98 %     Exercise Oxygen Saturation  during 6 min walk 95 %     Max Ex. HR 98 bpm     Max Ex. BP 158/84     2 Minute Post BP 150/86              Oxygen Initial Assessment:   Oxygen Re-Evaluation:   Oxygen Discharge (Final Oxygen Re-Evaluation):   Initial Exercise Prescription:  Initial Exercise Prescription - 10/17/20 1100       Date of Initial Exercise RX and Referring Provider   Date 10/17/20    Referring Provider Arnoldo Hooker MD      Treadmill   MPH 2.8    Grade 1    Minutes 15    METs 3.53      Recumbant Bike   Level 3    RPM 60    Watts 35    Minutes 15    METs 4      REL-XR   Level 3    Speed 50    Minutes 15    METs 4      T5 Nustep   Level 3    SPM 80    Minutes 15    METs 4      Prescription Details   Frequency (times per week) 3    Duration Progress to 30 minutes of continuous aerobic without signs/symptoms of physical distress      Intensity   THRR 40-80% of Max Heartrate 99-140    Ratings of Perceived Exertion 11-13    Perceived Dyspnea 0-4      Progression   Progression Continue to progress workloads  to maintain intensity without signs/symptoms of physical distress.      Resistance Training   Training Prescription Yes    Weight 4 lb    Reps 10-15             Perform Capillary Blood Glucose checks as needed.  Exercise Prescription Changes:   Exercise Prescription Changes     Row Name 10/17/20 1100             Response to Exercise  Blood Pressure (Admit) 152/88       Blood Pressure (Exercise) 158/84       Blood Pressure (Exit) 150/86       Heart Rate (Admit) 59 bpm       Heart Rate (Exercise) 98 bpm       Heart Rate (Exit) 63 bpm       Oxygen Saturation (Admit) 98 %       Oxygen Saturation (Exercise) 95 %       Oxygen Saturation (Exit) 99 %       Rating of Perceived Exertion (Exercise) 7       Perceived Dyspnea (Exercise) 0       Symptoms none       Comments walk test results                Exercise Comments:   Exercise Goals and Review:   Exercise Goals     Row Name 10/17/20 1201             Exercise Goals   Increase Physical Activity Yes       Intervention Provide advice, education, support and counseling about physical activity/exercise needs.;Develop an individualized exercise prescription for aerobic and resistive training based on initial evaluation findings, risk stratification, comorbidities and participant's personal goals.       Expected Outcomes Short Term: Attend rehab on a regular basis to increase amount of physical activity.;Long Term: Add in home exercise to make exercise part of routine and to increase amount of physical activity.;Long Term: Exercising regularly at least 3-5 days a week.       Increase Strength and Stamina Yes       Intervention Provide advice, education, support and counseling about physical activity/exercise needs.;Develop an individualized exercise prescription for aerobic and resistive training based on initial evaluation findings, risk stratification, comorbidities and participant's personal goals.        Expected Outcomes Short Term: Increase workloads from initial exercise prescription for resistance, speed, and METs.;Short Term: Perform resistance training exercises routinely during rehab and add in resistance training at home;Long Term: Improve cardiorespiratory fitness, muscular endurance and strength as measured by increased METs and functional capacity (6MWT)       Able to understand and use rate of perceived exertion (RPE) scale Yes       Intervention Provide education and explanation on how to use RPE scale       Expected Outcomes Short Term: Able to use RPE daily in rehab to express subjective intensity level;Long Term:  Able to use RPE to guide intensity level when exercising independently       Able to understand and use Dyspnea scale Yes       Intervention Provide education and explanation on how to use Dyspnea scale       Expected Outcomes Short Term: Able to use Dyspnea scale daily in rehab to express subjective sense of shortness of breath during exertion;Long Term: Able to use Dyspnea scale to guide intensity level when exercising independently       Knowledge and understanding of Target Heart Rate Range (THRR) Yes       Intervention Provide education and explanation of THRR including how the numbers were predicted and where they are located for reference       Expected Outcomes Short Term: Able to state/look up THRR;Long Term: Able to use THRR to govern intensity when exercising independently;Short Term: Able to use daily as guideline for intensity in rehab  Able to check pulse independently Yes       Intervention Provide education and demonstration on how to check pulse in carotid and radial arteries.;Review the importance of being able to check your own pulse for safety during independent exercise       Expected Outcomes Short Term: Able to explain why pulse checking is important during independent exercise;Long Term: Able to check pulse independently and accurately        Understanding of Exercise Prescription Yes       Intervention Provide education, explanation, and written materials on patient's individual exercise prescription       Expected Outcomes Short Term: Able to explain program exercise prescription;Long Term: Able to explain home exercise prescription to exercise independently                Exercise Goals Re-Evaluation :   Discharge Exercise Prescription (Final Exercise Prescription Changes):  Exercise Prescription Changes - 10/17/20 1100       Response to Exercise   Blood Pressure (Admit) 152/88    Blood Pressure (Exercise) 158/84    Blood Pressure (Exit) 150/86    Heart Rate (Admit) 59 bpm    Heart Rate (Exercise) 98 bpm    Heart Rate (Exit) 63 bpm    Oxygen Saturation (Admit) 98 %    Oxygen Saturation (Exercise) 95 %    Oxygen Saturation (Exit) 99 %    Rating of Perceived Exertion (Exercise) 7    Perceived Dyspnea (Exercise) 0    Symptoms none    Comments walk test results             Nutrition:  Target Goals: Understanding of nutrition guidelines, daily intake of sodium 1500mg , cholesterol 200mg , calories 30% from fat and 7% or less from saturated fats, daily to have 5 or more servings of fruits and vegetables.  Education: All About Nutrition: -Group instruction provided by verbal, written material, interactive activities, discussions, models, and posters to present general guidelines for heart healthy nutrition including fat, fiber, MyPlate, the role of sodium in heart healthy nutrition, utilization of the nutrition label, and utilization of this knowledge for meal planning. Follow up email sent as well. Written material given at graduation. Flowsheet Row Cardiac Rehab from 10/17/2020 in [facility] Cardiac and Pulmonary Rehab  Education need identified 10/17/20       Biometrics:  Pre Biometrics - 10/17/20 1010       Pre Biometrics   Height 5' 9.7" (1.77 m)    Weight 178 lb 1.6 oz (80.8 kg)    BMI (Calculated) 25.79     Single Leg Stand 12.37 seconds              Nutrition Therapy Plan and Nutrition Goals:  Nutrition Therapy & Goals - 10/17/20 1203       Intervention Plan   Intervention Prescribe, educate and counsel regarding individualized specific dietary modifications aiming towards targeted core components such as weight, hypertension, lipid management, diabetes, heart failure and other comorbidities.    Expected Outcomes Short Term Goal: Understand basic principles of dietary content, such as calories, fat, sodium, cholesterol and nutrients.;Short Term Goal: A plan has been developed with personal nutrition goals set during dietitian appointment.;Long Term Goal: Adherence to prescribed nutrition plan.             Nutrition Assessments:  MEDIFICTS Score Key: ?70 Need to make dietary changes  40-70 Heart Healthy Diet ? 40 Therapeutic Level Cholesterol Diet  Flowsheet Row Cardiac Rehab from 10/17/2020 in [facility]  Cardiac and Pulmonary Rehab  Picture Your Plate Total Score on Admission 60      Picture Your Plate Scores: <54 Unhealthy dietary pattern with much room for improvement. 41-50 Dietary pattern unlikely to meet recommendations for good health and room for improvement. 51-60 More healthful dietary pattern, with some room for improvement.  >60 Healthy dietary pattern, although there may be some specific behaviors that could be improved.    Nutrition Goals Re-Evaluation:   Nutrition Goals Discharge (Final Nutrition Goals Re-Evaluation):   Psychosocial: Target Goals: Acknowledge presence or absence of significant depression and/or stress, maximize coping skills, provide positive support system. Participant is able to verbalize types and ability to use techniques and skills needed for reducing stress and depression.   Education: Stress, Anxiety, and Depression - Group verbal and visual presentation to define topics covered.  Reviews how body is impacted by stress, anxiety, and  depression.  Also discusses healthy ways to reduce stress and to treat/manage anxiety and depression.  Written material given at graduation.   Education: Sleep Hygiene -Provides group verbal and written instruction about how sleep can affect your health.  Define sleep hygiene, discuss sleep cycles and impact of sleep habits. Review good sleep hygiene tips.    Initial Review & Psychosocial Screening:  Initial Psych Review & Screening - 10/04/20 1417       Initial Review   Current issues with Current Stress Concerns    Source of Stress Concerns Occupation      Family Dynamics   Good Support System? Yes   wife, friends     Barriers   Psychosocial barriers to participate in program There are no identifiable barriers or psychosocial needs.;The patient should benefit from training in stress management and relaxation.      Screening Interventions   Interventions Encouraged to exercise;Provide feedback about the scores to participant;To provide support and resources with identified psychosocial needs    Expected Outcomes Short Term goal: Utilizing psychosocial counselor, staff and physician to assist with identification of specific Stressors or current issues interfering with healing process. Setting desired goal for each stressor or current issue identified.;Long Term Goal: Stressors or current issues are controlled or eliminated.;Short Term goal: Identification and review with participant of any Quality of Life or Depression concerns found by scoring the questionnaire.;Long Term goal: The participant improves quality of Life and PHQ9 Scores as seen by post scores and/or verbalization of changes             Quality of Life Scores:   Quality of Life - 10/17/20 1204       Quality of Life   Select Quality of Life      Quality of Life Scores   Health/Function Pre 28 %    Socioeconomic Pre 29.25 %    Psych/Spiritual Pre 30 %    Family Pre 30 %    GLOBAL Pre 28.97 %             Scores of 19 and below usually indicate a poorer quality of life in these areas.  A difference of  2-3 points is a clinically meaningful difference.  A difference of 2-3 points in the total score of the Quality of Life Index has been associated with significant improvement in overall quality of life, self-image, physical symptoms, and general health in studies assessing change in quality of life.  PHQ-9: Recent Review Flowsheet Data     Depression screen [facility] 2/9 10/17/2020   Decreased Interest 0   Down, Depressed,  Hopeless 0   PHQ - 2 Score 0   Altered sleeping 1   Tired, decreased energy 1   Change in appetite 0   Feeling bad or failure about yourself  0   Trouble concentrating 0   Moving slowly or fidgety/restless 0   Suicidal thoughts 0   PHQ-9 Score 2   Difficult doing work/chores Not difficult at all      Interpretation of Total Score  Total Score Depression Severity:  1-4 = Minimal depression, 5-9 = Mild depression, 10-14 = Moderate depression, 15-19 = Moderately severe depression, 20-27 = Severe depression   Psychosocial Evaluation and Intervention:  Psychosocial Evaluation - 10/04/20 1421       Psychosocial Evaluation & Interventions   Interventions Encouraged to exercise with the program and follow exercise prescription    Comments Mr. Coufal reports feeling well post AV replacement. He is taking it easy while he heals, but is ready to get back to his normal lifestyle. He plans on returning to work sometime in October. His biggest stressor is related to his insurance. His company is switching insurances the end of September and it is causing complications on coverage while he is still out from surgery. He will only be able to attend the program until the end of September when the insurances switch. He is hopeful to be able to work hard and learn how to maintain a heart healthy lifestyle. His wife and friends are very supportive of his recovery.    Expected Outcomes Short:  attend cardiac rehab for education and exercise. Long: develop and maintain positive self care habits             Psychosocial Re-Evaluation:   Psychosocial Discharge (Final Psychosocial Re-Evaluation):   Vocational Rehabilitation: Provide vocational rehab assistance to qualifying candidates.   Vocational Rehab Evaluation & Intervention:  Vocational Rehab - 10/04/20 1416       Initial Vocational Rehab Evaluation & Intervention   Assessment shows need for Vocational Rehabilitation No             Education: Education Goals: Education classes will be provided on a variety of topics geared toward better understanding of heart health and risk factor modification. Participant will state understanding/return demonstration of topics presented as noted by education test scores.  Learning Barriers/Preferences:  Learning Barriers/Preferences - 10/04/20 1416       Learning Barriers/Preferences   Learning Barriers None    Learning Preferences None             General Cardiac Education Topics:  AED/CPR: - Group verbal and written instruction with the use of models to demonstrate the basic use of the AED with the basic ABC's of resuscitation.   Anatomy and Cardiac Procedures: - Group verbal and visual presentation and models provide information about basic cardiac anatomy and function. Reviews the testing methods done to diagnose heart disease and the outcomes of the test results. Describes the treatment choices: Medical Management, Angioplasty, or Coronary Bypass Surgery for treating various heart conditions including Myocardial Infarction, Angina, Valve Disease, and Cardiac Arrhythmias.  Written material given at graduation.   Medication Safety: - Group verbal and visual instruction to review commonly prescribed medications for heart and lung disease. Reviews the medication, class of the drug, and side effects. Includes the steps to properly store meds and maintain the  prescription regimen.  Written material given at graduation.   Intimacy: - Group verbal instruction through game format to discuss how heart and lung disease can affect  sexual intimacy. Written material given at graduation..   Know Your Numbers and Heart Failure: - Group verbal and visual instruction to discuss disease risk factors for cardiac and pulmonary disease and treatment options.  Reviews associated critical values for Overweight/Obesity, Hypertension, Cholesterol, and Diabetes.  Discusses basics of heart failure: signs/symptoms and treatments.  Introduces Heart Failure Zone chart for action plan for heart failure.  Written material given at graduation.   Infection Prevention: - Provides verbal and written material to individual with discussion of infection control including proper hand washing and proper equipment cleaning during exercise session. Flowsheet Row Cardiac Rehab from 10/17/2020 in [facility] Cardiac and Pulmonary Rehab  Education need identified 10/17/20  Date 10/17/20  Educator KL  Instruction Review Code 1- Verbalizes Understanding       Falls Prevention: - Provides verbal and written material to individual with discussion of falls prevention and safety. Flowsheet Row Cardiac Rehab from 10/17/2020 in [facility] Cardiac and Pulmonary Rehab  Education need identified 10/17/20  Date 10/17/20  Educator KL  Instruction Review Code 1- Verbalizes Understanding       Other: -Provides group and verbal instruction on various topics (see comments)   Knowledge Questionnaire Score:  Knowledge Questionnaire Score - 10/17/20 1009       Knowledge Questionnaire Score   Pre Score 22/26: Angina, Nutrition, Exercise             Core Components/Risk Factors/Patient Goals at Admission:  Personal Goals and Risk Factors at Admission - 10/17/20 1202       Core Components/Risk Factors/Patient Goals on Admission    Weight Management Yes;Weight Maintenance   Per patient    Intervention Weight Management: Develop a combined nutrition and exercise program designed to reach desired caloric intake, while maintaining appropriate intake of nutrient and fiber, sodium and fats, and appropriate energy expenditure required for the weight goal.;Weight Management: Provide education and appropriate resources to help participant work on and attain dietary goals.;Weight Management/Obesity: Establish reasonable short term and long term weight goals.    Admit Weight 178 lb (80.7 kg)    Goal Weight: Short Term 178 lb (80.7 kg)    Goal Weight: Long Term 178 lb (80.7 kg)    Expected Outcomes Short Term: Continue to assess and modify interventions until short term weight is achieved;Long Term: Adherence to nutrition and physical activity/exercise program aimed toward attainment of established weight goal;Weight Maintenance: Understanding of the daily nutrition guidelines, which includes 25-35% calories from fat, 7% or less cal from saturated fats, less than 200mg  cholesterol, less than 1.5gm of sodium, & 5 or more servings of fruits and vegetables daily;Understanding recommendations for meals to include 15-35% energy as protein, 25-35% energy from fat, 35-60% energy from carbohydrates, less than 200mg  of dietary cholesterol, 20-35 gm of total fiber daily;Understanding of distribution of calorie intake throughout the day with the consumption of 4-5 meals/snacks    Hypertension Yes    Intervention Provide education on lifestyle modifcations including regular physical activity/exercise, weight management, moderate sodium restriction and increased consumption of fresh fruit, vegetables, and low fat dairy, alcohol moderation, and smoking cessation.;Monitor prescription use compliance.    Expected Outcomes Short Term: Continued assessment and intervention until BP is < 140/72mm HG in hypertensive participants. < 130/72mm HG in hypertensive participants with diabetes, heart failure or chronic kidney  disease.;Long Term: Maintenance of blood pressure at goal levels.    Lipids Yes    Intervention Provide education and support for participant on nutrition & aerobic/resistive exercise along with prescribed  medications to achieve LDL 70mg , HDL >40mg .    Expected Outcomes Short Term: Participant states understanding of desired cholesterol values and is compliant with medications prescribed. Participant is following exercise prescription and nutrition guidelines.;Long Term: Cholesterol controlled with medications as prescribed, with individualized exercise RX and with personalized nutrition plan. Value goals: LDL < 70mg , HDL > 40 mg.             Education:Diabetes - Individual verbal and written instruction to review signs/symptoms of diabetes, desired ranges of glucose level fasting, after meals and with exercise. Acknowledge that pre and post exercise glucose checks will be done for 3 sessions at entry of program.   Core Components/Risk Factors/Patient Goals Review:    Core Components/Risk Factors/Patient Goals at Discharge (Final Review):    ITP Comments:  ITP Comments     Row Name 10/04/20 1412 10/17/20 1008         ITP Comments Initial telephone orientation completed. Diagnosis can be found in [facility] 8/4. EP orientation scheduled for Thursday 9/8 at 8am. Completed 05-26-2002 and gym orientation. Initial ITP created and sent for review to Dr. , Medical Director.               Comments: Initial ITP

## 2020-10-17 NOTE — Patient Instructions (Signed)
Patient Instructions  Patient Details  Name: Carlos Lopez MRN: [id number] Date of Birth: 08-Feb-1961 Referring Provider:  Lamar Blinks, MD  Below are your personal goals for exercise, nutrition, and risk factors. Our goal is to help you stay on track towards obtaining and maintaining these goals. We will be discussing your progress on these goals with you throughout the program.  Initial Exercise Prescription:  Initial Exercise Prescription - 10/17/20 1100       Date of Initial Exercise RX and Referring Provider   Date 10/17/20    Referring Provider Arnoldo Hooker MD      Treadmill   MPH 2.8    Grade 1    Minutes 15    METs 3.53      Recumbant Bike   Level 3    RPM 60    Watts 35    Minutes 15    METs 4      REL-XR   Level 3    Speed 50    Minutes 15    METs 4      T5 Nustep   Level 3    SPM 80    Minutes 15    METs 4      Prescription Details   Frequency (times per week) 3    Duration Progress to 30 minutes of continuous aerobic without signs/symptoms of physical distress      Intensity   THRR 40-80% of Max Heartrate 99-140    Ratings of Perceived Exertion 11-13    Perceived Dyspnea 0-4      Progression   Progression Continue to progress workloads to maintain intensity without signs/symptoms of physical distress.      Resistance Training   Training Prescription Yes    Weight 4 lb    Reps 10-15             Exercise Goals: Frequency: Be able to perform aerobic exercise two to three times per week in program working toward 2-5 days per week of home exercise.  Intensity: Work with a perceived exertion of 11 (fairly light) - 15 (hard) while following your exercise prescription.  We will make changes to your prescription with you as you progress through the program.   Duration: Be able to do 30 to 45 minutes of continuous aerobic exercise in addition to a 5 minute warm-up and a 5 minute cool-down routine.   Nutrition Goals: Your personal  nutrition goals will be established when you do your nutrition analysis with the dietician.  The following are general nutrition guidelines to follow: Cholesterol < 200mg /day Sodium < 1500mg /day Fiber: Men over 50 yrs - 30 grams per day  Personal Goals:  Personal Goals and Risk Factors at Admission - 10/17/20 1202       Core Components/Risk Factors/Patient Goals on Admission    Weight Management Yes;Weight Maintenance   Per patient   Intervention Weight Management: Develop a combined nutrition and exercise program designed to reach desired caloric intake, while maintaining appropriate intake of nutrient and fiber, sodium and fats, and appropriate energy expenditure required for the weight goal.;Weight Management: Provide education and appropriate resources to help participant work on and attain dietary goals.;Weight Management/Obesity: Establish reasonable short term and long term weight goals.    Admit Weight 178 lb (80.7 kg)    Goal Weight: Short Term 178 lb (80.7 kg)    Goal Weight: Long Term 178 lb (80.7 kg)    Expected Outcomes Short Term: Continue to assess and modify  interventions until short term weight is achieved;Long Term: Adherence to nutrition and physical activity/exercise program aimed toward attainment of established weight goal;Weight Maintenance: Understanding of the daily nutrition guidelines, which includes 25-35% calories from fat, 7% or less cal from saturated fats, less than 200mg  cholesterol, less than 1.5gm of sodium, & 5 or more servings of fruits and vegetables daily;Understanding recommendations for meals to include 15-35% energy as protein, 25-35% energy from fat, 35-60% energy from carbohydrates, less than 200mg  of dietary cholesterol, 20-35 gm of total fiber daily;Understanding of distribution of calorie intake throughout the day with the consumption of 4-5 meals/snacks    Hypertension Yes    Intervention Provide education on lifestyle modifcations including regular  physical activity/exercise, weight management, moderate sodium restriction and increased consumption of fresh fruit, vegetables, and low fat dairy, alcohol moderation, and smoking cessation.;Monitor prescription use compliance.    Expected Outcomes Short Term: Continued assessment and intervention until BP is < 140/16mm HG in hypertensive participants. < 130/39mm HG in hypertensive participants with diabetes, heart failure or chronic kidney disease.;Long Term: Maintenance of blood pressure at goal levels.    Lipids Yes    Intervention Provide education and support for participant on nutrition & aerobic/resistive exercise along with prescribed medications to achieve LDL 70mg , HDL >40mg .    Expected Outcomes Short Term: Participant states understanding of desired cholesterol values and is compliant with medications prescribed. Participant is following exercise prescription and nutrition guidelines.;Long Term: Cholesterol controlled with medications as prescribed, with individualized exercise RX and with personalized nutrition plan. Value goals: LDL < 70mg , HDL > 40 mg.             Tobacco Use Initial Evaluation: Social History   Tobacco Use  Smoking Status Never  Smokeless Tobacco Former    Exercise Goals and Review:  Exercise Goals     Row Name 10/17/20 1201             Exercise Goals   Increase Physical Activity Yes       Intervention Provide advice, education, support and counseling about physical activity/exercise needs.;Develop an individualized exercise prescription for aerobic and resistive training based on initial evaluation findings, risk stratification, comorbidities and participant's personal goals.       Expected Outcomes Short Term: Attend rehab on a regular basis to increase amount of physical activity.;Long Term: Add in home exercise to make exercise part of routine and to increase amount of physical activity.;Long Term: Exercising regularly at least 3-5 days a week.        Increase Strength and Stamina Yes       Intervention Provide advice, education, support and counseling about physical activity/exercise needs.;Develop an individualized exercise prescription for aerobic and resistive training based on initial evaluation findings, risk stratification, comorbidities and participant's personal goals.       Expected Outcomes Short Term: Increase workloads from initial exercise prescription for resistance, speed, and METs.;Short Term: Perform resistance training exercises routinely during rehab and add in resistance training at home;Long Term: Improve cardiorespiratory fitness, muscular endurance and strength as measured by increased METs and functional capacity (91m)       Able to understand and use rate of perceived exertion (RPE) scale Yes       Intervention Provide education and explanation on how to use RPE scale       Expected Outcomes Short Term: Able to use RPE daily in rehab to express subjective intensity level;Long Term:  Able to use RPE to guide intensity level when exercising independently  Able to understand and use Dyspnea scale Yes       Intervention Provide education and explanation on how to use Dyspnea scale       Expected Outcomes Short Term: Able to use Dyspnea scale daily in rehab to express subjective sense of shortness of breath during exertion;Long Term: Able to use Dyspnea scale to guide intensity level when exercising independently       Knowledge and understanding of Target Heart Rate Range (THRR) Yes       Intervention Provide education and explanation of THRR including how the numbers were predicted and where they are located for reference       Expected Outcomes Short Term: Able to state/look up THRR;Long Term: Able to use THRR to govern intensity when exercising independently;Short Term: Able to use daily as guideline for intensity in rehab       Able to check pulse independently Yes       Intervention Provide education and  demonstration on how to check pulse in carotid and radial arteries.;Review the importance of being able to check your own pulse for safety during independent exercise       Expected Outcomes Short Term: Able to explain why pulse checking is important during independent exercise;Long Term: Able to check pulse independently and accurately       Understanding of Exercise Prescription Yes       Intervention Provide education, explanation, and written materials on patient's individual exercise prescription       Expected Outcomes Short Term: Able to explain program exercise prescription;Long Term: Able to explain home exercise prescription to exercise independently                Copy of goals given to participant.

## 2020-10-21 ENCOUNTER — Other Ambulatory Visit: Payer: Self-pay

## 2020-10-21 DIAGNOSIS — Z952 Presence of prosthetic heart valve: Secondary | ICD-10-CM | POA: Diagnosis not present

## 2020-10-21 NOTE — Progress Notes (Signed)
Daily Session Note  Patient Details  Name: Carlos Lopez MRN: [id number] Date of Birth: 1960/07/01 Referring Provider:   Flowsheet Row Cardiac Rehab from 10/17/2020 in [facility] Cardiac and Pulmonary Rehab  Referring Provider Serafina Royals MD       Encounter Date: 10/21/2020  Check In:  Session Check In - 10/21/20 1540       Check-In   Supervising physician immediately available to respond to emergencies See telemetry face sheet for immediately available ER MD    Location [facility]-Cardiac & Pulmonary Rehab    Staff Present Birdie Sons, MPA, Nino Glow, MS, ASCM CEP, Exercise Physiologist;Meredith Sherryll Burger, RN BSN    Virtual Visit No    Medication changes reported     No    Fall or balance concerns reported    No    Warm-up and Cool-down Performed on first and last piece of equipment    Resistance Training Performed Yes    VAD Patient? No    PAD/SET Patient? No      Pain Assessment   Currently in Pain? No/denies                Social History   Tobacco Use  Smoking Status Never  Smokeless Tobacco Former    Goals Met:  Independence with exercise equipment Exercise tolerated well No report of concerns or symptoms today Strength training completed today  Goals Unmet:  Not Applicable  Comments: First full day of exercise!  Patient was oriented to gym and equipment including functions, settings, policies, and procedures.  Patient's individual exercise prescription and treatment plan were reviewed.  All starting workloads were established based on the results of the 6 minute walk test done at initial orientation visit.  The plan for exercise progression was also introduced and progression will be customized based on patient's performance and goals.    Dr. Emily Filbert is Medical Director for [place].  Dr. Ottie Glazier is Medical Director for [facility] Pulmonary Rehabilitation.

## 2020-10-23 ENCOUNTER — Other Ambulatory Visit: Payer: Self-pay

## 2020-10-23 DIAGNOSIS — Z952 Presence of prosthetic heart valve: Secondary | ICD-10-CM

## 2020-10-23 NOTE — Progress Notes (Signed)
Daily Session Note  Patient Details  Name: Carlos Lopez MRN: [id number] Date of Birth: [birth year] Referring Provider:   Flowsheet Row Cardiac Rehab from 10/17/2020 in [facility] Cardiac and Pulmonary Rehab  Referring Provider Serafina Royals MD       Encounter Date: 10/23/2020  Check In:  Session Check In - 10/23/20 1550       Check-In   Supervising physician immediately available to respond to emergencies See telemetry face sheet for immediately available ER MD    Location [facility]-Cardiac & Pulmonary Rehab    Staff Present Birdie Sons, MPA, Nino Glow, MS, ASCM CEP, Exercise Physiologist;Joseph Tessie Fass, Cristopher Estimable, RN BSN    Virtual Visit No    Medication changes reported     No    Fall or balance concerns reported    No    Warm-up and Cool-down Performed on first and last piece of equipment    Resistance Training Performed Yes    VAD Patient? No    PAD/SET Patient? No      Pain Assessment   Currently in Pain? No/denies                Social History   Tobacco Use  Smoking Status Never  Smokeless Tobacco Former    Goals Met:  Independence with exercise equipment Exercise tolerated well No report of concerns or symptoms today Strength training completed today  Goals Unmet:  Not Applicable  Comments: Pt able to follow exercise prescription today without complaint.  Will continue to monitor for progression.    Dr. Emily Filbert is Medical Director for [place].  Dr. Ottie Glazier is Medical Director for [facility] Pulmonary Rehabilitation.

## 2020-10-24 ENCOUNTER — Other Ambulatory Visit: Payer: Self-pay

## 2020-10-24 ENCOUNTER — Encounter: Payer: 59 | Admitting: *Deleted

## 2020-10-24 DIAGNOSIS — Z952 Presence of prosthetic heart valve: Secondary | ICD-10-CM | POA: Diagnosis not present

## 2020-10-24 NOTE — Progress Notes (Signed)
Daily Session Note  Patient Details  Name: Carlos Lopez MRN: [id number] Date of Birth: 01-Oct-1960 Referring Provider:   Flowsheet Row Cardiac Rehab from 10/17/2020 in [facility] Cardiac and Pulmonary Rehab  Referring Provider Serafina Royals MD       Encounter Date: 10/24/2020  Check In:  Session Check In - 10/24/20 1532       Check-In   Supervising physician immediately available to respond to emergencies See telemetry face sheet for immediately available ER MD    Location [facility]-Cardiac & Pulmonary Rehab    Staff Present Renita Papa, RN BSN;Joseph Waldo, RCP,RRT,BSRT;Jessica [place], [place], RCEP, CCRP, CCET    Virtual Visit No    Medication changes reported     No    Fall or balance concerns reported    No    Warm-up and Cool-down Performed on first and last piece of equipment    Resistance Training Performed Yes    VAD Patient? No    PAD/SET Patient? No      Pain Assessment   Currently in Pain? No/denies                Social History   Tobacco Use  Smoking Status Never  Smokeless Tobacco Former    Goals Met:  Independence with exercise equipment Exercise tolerated well No report of concerns or symptoms today Strength training completed today  Goals Unmet:  Not Applicable  Comments: Pt able to follow exercise prescription today without complaint.  Will continue to monitor for progression.    Dr. Emily Filbert is Medical Director for [place].  Dr. Ottie Glazier is Medical Director for [facility] Pulmonary Rehabilitation.

## 2020-10-28 ENCOUNTER — Other Ambulatory Visit: Payer: Self-pay

## 2020-10-28 DIAGNOSIS — Z952 Presence of prosthetic heart valve: Secondary | ICD-10-CM | POA: Diagnosis not present

## 2020-10-28 NOTE — Progress Notes (Signed)
Daily Session Note  Patient Details  Name: Carlos Lopez MRN: [id number] Date of Birth: 11-09-1960 Referring Provider:   Flowsheet Row Cardiac Rehab from 10/17/2020 in [facility] Cardiac and Pulmonary Rehab  Referring Provider Serafina Royals MD       Encounter Date: 10/28/2020  Check In:  Session Check In - 10/28/20 0748       Check-In   Supervising physician immediately available to respond to emergencies See telemetry face sheet for immediately available ER MD    Location [facility]-Cardiac & Pulmonary Rehab    Staff Present Birdie Sons, MPA, Mauricia Area, BS, ACSM CEP, Exercise Physiologist;Joseph Tessie Fass, [place]    Virtual Visit No    Medication changes reported     No    Fall or balance concerns reported    No    Warm-up and Cool-down Performed on first and last piece of equipment    Resistance Training Performed Yes    VAD Patient? No    PAD/SET Patient? No      Pain Assessment   Currently in Pain? No/denies                Social History   Tobacco Use  Smoking Status Never  Smokeless Tobacco Former    Goals Met:  Independence with exercise equipment Exercise tolerated well No report of concerns or symptoms today Strength training completed today  Goals Unmet:  Not Applicable  Comments: Pt able to follow exercise prescription today without complaint.  Will continue to monitor for progression.    Dr. Emily Filbert is Medical Director for [place].  Dr. Ottie Glazier is Medical Director for [facility] Pulmonary Rehabilitation.

## 2020-10-30 ENCOUNTER — Other Ambulatory Visit: Payer: Self-pay

## 2020-10-30 DIAGNOSIS — Z952 Presence of prosthetic heart valve: Secondary | ICD-10-CM | POA: Diagnosis not present

## 2020-10-30 NOTE — Progress Notes (Signed)
Daily Session Note  Patient Details  Name: Carlos Lopez MRN: [id number] Date of Birth: 04/04/1960 Referring Provider:   Flowsheet Row Cardiac Rehab from 10/17/2020 in [facility] Cardiac and Pulmonary Rehab  Referring Provider Serafina Royals MD       Encounter Date: 10/30/2020  Check In:  Session Check In - 10/30/20 0744       Check-In   Supervising physician immediately available to respond to emergencies See telemetry face sheet for immediately available ER MD    Location [facility]-Cardiac & Pulmonary Rehab    Staff Present Birdie Sons, MPA, Elveria Rising, BA, ACSM CEP, Exercise Physiologist;Joseph Tessie Fass, [place]    Virtual Visit No    Medication changes reported     No    Fall or balance concerns reported    No    Warm-up and Cool-down Performed on first and last piece of equipment    Resistance Training Performed Yes    VAD Patient? No    PAD/SET Patient? No      Pain Assessment   Currently in Pain? No/denies                Social History   Tobacco Use  Smoking Status Never  Smokeless Tobacco Former    Goals Met:  Independence with exercise equipment Exercise tolerated well No report of concerns or symptoms today Strength training completed today  Goals Unmet:  Not Applicable  Comments: Pt able to follow exercise prescription today without complaint.  Will continue to monitor for progression.    Dr. Emily Filbert is Medical Director for [place].  Dr. Ottie Glazier is Medical Director for [facility] Pulmonary Rehabilitation.

## 2020-10-31 ENCOUNTER — Other Ambulatory Visit: Payer: Self-pay

## 2020-10-31 DIAGNOSIS — Z952 Presence of prosthetic heart valve: Secondary | ICD-10-CM

## 2020-10-31 NOTE — Progress Notes (Signed)
Daily Session Note  Patient Details  Name: Carlos Lopez MRN: [id number] Date of Birth: 1960/04/27 Referring Provider:   Flowsheet Row Cardiac Rehab from 10/17/2020 in [facility] Cardiac and Pulmonary Rehab  Referring Provider Serafina Royals MD       Encounter Date: 10/31/2020  Check In:  Session Check In - 10/31/20 0742       Check-In   Supervising physician immediately available to respond to emergencies See telemetry face sheet for immediately available ER MD    Location [facility]-Cardiac & Pulmonary Rehab    Staff Present Birdie Sons, MPA, Elveria Rising, BA, ACSM CEP, Exercise Physiologist;Devynne Sturdivant Amedeo Plenty, BS, ACSM CEP, Exercise Physiologist    Virtual Visit No    Medication changes reported     No    Fall or balance concerns reported    No    Warm-up and Cool-down Performed on first and last piece of equipment    Resistance Training Performed Yes    VAD Patient? No    PAD/SET Patient? No      Pain Assessment   Currently in Pain? No/denies                Social History   Tobacco Use  Smoking Status Never  Smokeless Tobacco Former    Goals Met:  Independence with exercise equipment Exercise tolerated well No report of concerns or symptoms today Strength training completed today  Goals Unmet:  Not Applicable  Comments: Pt able to follow exercise prescription today without complaint.  Will continue to monitor for progression.    Dr. Emily Filbert is Medical Director for [place].  Dr. Ottie Glazier is Medical Director for [facility] Pulmonary Rehabilitation.

## 2020-11-06 ENCOUNTER — Encounter: Payer: 59 | Admitting: *Deleted

## 2020-11-06 ENCOUNTER — Other Ambulatory Visit: Payer: Self-pay

## 2020-11-06 DIAGNOSIS — Z952 Presence of prosthetic heart valve: Secondary | ICD-10-CM | POA: Diagnosis not present

## 2020-11-06 NOTE — Progress Notes (Signed)
Daily Session Note  Patient Details  Name: Carlos Lopez MRN: [id number] Date of Birth: 01/05/1961 Referring Provider:   Flowsheet Row Cardiac Rehab from 10/17/2020 in [facility] Cardiac and Pulmonary Rehab  Referring Provider Serafina Royals MD       Encounter Date: 11/06/2020  Check In:  Session Check In - 11/06/20 0826       Check-In   Supervising physician immediately available to respond to emergencies See telemetry face sheet for immediately available ER MD    Location [facility]-Cardiac & Pulmonary Rehab    Staff Present Nyoka Cowden, RN, BSN, Willette Pa, MA, RCEP, CCRP, [place], [place], ACSM CEP, Exercise Physiologist;Joseph [place], [place]    Virtual Visit No    Medication changes reported     No    Fall or balance concerns reported    No    Tobacco Cessation No Change    Warm-up and Cool-down Performed on first and last piece of equipment    Resistance Training Performed Yes    VAD Patient? No    PAD/SET Patient? No      Pain Assessment   Currently in Pain? No/denies                Social History   Tobacco Use  Smoking Status Never  Smokeless Tobacco Former    Goals Met:  Independence with exercise equipment Exercise tolerated well No report of concerns or symptoms today  Goals Unmet:  Not Applicable  Comments: Pt able to follow exercise prescription today without complaint.  Will continue to monitor for progression.    Dr. Emily Filbert is Medical Director for [place].  Dr. Ottie Glazier is Medical Director for [facility] Pulmonary Rehabilitation.

## 2020-11-06 NOTE — Patient Instructions (Signed)
Discharge Patient Instructions  Patient Details  Name: Carlos Lopez MRN: [id number] Date of Birth: 1960-02-29 Referring Provider:  Lamar Blinks, MD   Number of Visits: 12   Reason for Discharge:  Patient reached a stable level of exercise. Early Exit:  Insurance  Smoking History:  Social History   Tobacco Use  Smoking Status Never  Smokeless Tobacco Former    Diagnosis:  S/P AVR (aortic valve replacement)  Initial Exercise Prescription:  Initial Exercise Prescription - 10/17/20 1100       Date of Initial Exercise RX and Referring Provider   Date 10/17/20    Referring Provider Arnoldo Hooker MD      Treadmill   MPH 2.8    Grade 1    Minutes 15    METs 3.53      Recumbant Bike   Level 3    RPM 60    Watts 35    Minutes 15    METs 4      REL-XR   Level 3    Speed 50    Minutes 15    METs 4      T5 Nustep   Level 3    SPM 80    Minutes 15    METs 4      Prescription Details   Frequency (times per week) 3    Duration Progress to 30 minutes of continuous aerobic without signs/symptoms of physical distress      Intensity   THRR 40-80% of Max Heartrate 99-140    Ratings of Perceived Exertion 11-13    Perceived Dyspnea 0-4      Progression   Progression Continue to progress workloads to maintain intensity without signs/symptoms of physical distress.      Resistance Training   Training Prescription Yes    Weight 4 lb    Reps 10-15             Discharge Exercise Prescription (Final Exercise Prescription Changes):  Exercise Prescription Changes - 11/05/20 1400       Response to Exercise   Blood Pressure (Admit) 142/80    Blood Pressure (Exercise) 152/82    Blood Pressure (Exit) 120/80    Heart Rate (Admit) 70 bpm    Heart Rate (Exercise) 110 bpm    Heart Rate (Exit) 84 bpm    Rating of Perceived Exertion (Exercise) 12    Symptoms none    Duration Progress to 30 minutes of  aerobic without signs/symptoms of physical distress     Intensity THRR unchanged      Progression   Progression Continue to progress workloads to maintain intensity without signs/symptoms of physical distress.    Average METs 3.17      Resistance Training   Training Prescription Yes    Weight 4 lb    Reps 10-15      Treadmill   MPH 3    Grade 3    Minutes 15    METs 4.54      REL-XR   Level 1    Speed 50    Minutes 15    METs 1.8      Home Exercise Plan   Plans to continue exercise at Home (comment)   Treadmill, recumbant bike, walking   Frequency Add 2 additional days to program exercise sessions.    Initial Home Exercises Provided 10/23/20             Functional Capacity:  6 Minute Walk  Row Name 10/17/20 1151         6 Minute Walk   Phase Initial     Distance 1490 feet     Walk Time 6 minutes     # of Rest Breaks 0     MPH 2.82     METS 4.16     RPE 7     Perceived Dyspnea  0     VO2 Peak 14.56     Symptoms No     Resting HR 59 bpm     Resting BP 152/88     Resting Oxygen Saturation  98 %     Exercise Oxygen Saturation  during 6 min walk 95 %     Max Ex. HR 98 bpm     Max Ex. BP 158/84     2 Minute Post BP 150/86               Nutrition & Weight - Outcomes:  Pre Biometrics - 10/17/20 1010       Pre Biometrics   Height 5' 9.7" (1.77 m)    Weight 178 lb 1.6 oz (80.8 kg)    BMI (Calculated) 25.79    Single Leg Stand 12.37 seconds             Goals reviewed with patient; copy given to patient.

## 2020-11-07 ENCOUNTER — Other Ambulatory Visit: Payer: Self-pay

## 2020-11-07 ENCOUNTER — Encounter: Payer: 59 | Admitting: *Deleted

## 2020-11-07 DIAGNOSIS — Z952 Presence of prosthetic heart valve: Secondary | ICD-10-CM

## 2020-11-07 NOTE — Progress Notes (Signed)
Discharge Progress Report  Patient Details  Name: Carlos Lopez MRN: [id number] Date of Birth: 11-Oct-1960 Referring Provider:   Flowsheet Row Cardiac Rehab from 10/17/2020 in [facility]  Referring Provider Carlos Hooker MD        Number of Visits: 12  Reason for Discharge:  Patient independent in their exercise. Early Exit:  Insurance  Smoking History:  Social History   Tobacco Use  Smoking Status Never  Smokeless Tobacco Former    Diagnosis:  S/P AVR (aortic valve replacement)  ADL [facility]:   Initial Exercise Prescription:  Initial Exercise Prescription - 10/17/20 1100       Date of Initial Exercise RX and Referring Provider   Date 10/17/20    Referring Provider Carlos Hooker MD      Treadmill   MPH 2.8    Grade 1    Minutes 15    METs 3.53      Recumbant Bike   Level 3    RPM 60    Watts 35    Minutes 15    METs 4      REL-XR   Level 3    Speed 50    Minutes 15    METs 4      T5 Nustep   Level 3    SPM 80    Minutes 15    METs 4      Prescription Details   Frequency (times per week) 3    Duration Progress to 30 minutes of continuous aerobic without signs/symptoms of physical distress      Intensity   THRR 40-80% of Max Heartrate 99-140    Ratings of Perceived Exertion 11-13    Perceived Dyspnea 0-4      Progression   Progression Continue to progress workloads to maintain intensity without signs/symptoms of physical distress.      Resistance Training   Training Prescription Yes    Weight 4 lb    Reps 10-15             Discharge Exercise Prescription (Final Exercise Prescription Changes):  Exercise Prescription Changes - 11/05/20 1400       Response to Exercise   Blood Pressure (Admit) 142/80    Blood Pressure (Exercise) 152/82    Blood Pressure (Exit) 120/80    Heart Rate (Admit) 70 bpm    Heart Rate (Exercise) 110 bpm    Heart Rate (Exit) 84 bpm    Rating of Perceived Exertion (Exercise) 12     Symptoms none    Duration Progress to 30 minutes of  aerobic without signs/symptoms of physical distress    Intensity THRR unchanged      Progression   Progression Continue to progress workloads to maintain intensity without signs/symptoms of physical distress.    Average METs 3.17      Resistance Training   Training Prescription Yes    Weight 4 lb    Reps 10-15      Treadmill   MPH 3    Grade 3    Minutes 15    METs 4.54      REL-XR   Level 1    Speed 50    Minutes 15    METs 1.8      Home Exercise Plan   Plans to continue exercise at Home (comment)   Treadmill, recumbant bike, walking   Frequency Add 2 additional days to program exercise sessions.    Initial Home Exercises Provided 10/23/20  Functional Capacity:  6 Minute Walk     Row Name 10/17/20 1151         6 Minute Walk   Phase Initial     Distance 1490 feet     Walk Time 6 minutes     # of Rest Breaks 0     MPH 2.82     METS 4.16     RPE 7     Perceived Dyspnea  0     VO2 Peak 14.56     Symptoms No     Resting HR 59 bpm     Resting BP 152/88     Resting Oxygen Saturation  98 %     Exercise Oxygen Saturation  during 6 min walk 95 %     Max Ex. HR 98 bpm     Max Ex. BP 158/84     2 Minute Post BP 150/86              Psychological, QOL, Others - Outcomes: PHQ 2/9: Depression screen PHQ 2/9 10/17/2020  Decreased Interest 0  Down, Depressed, Hopeless 0  PHQ - 2 Score 0  Altered sleeping 1  Tired, decreased energy 1  Change in appetite 0  Feeling bad or failure about yourself  0  Trouble concentrating 0  Moving slowly or fidgety/restless 0  Suicidal thoughts 0  PHQ-9 Score 2  Difficult doing work/chores Not difficult at all    Quality of Life:  Quality of Life - 10/17/20 1204       Quality of Life   Select Quality of Life      Quality of Life Scores   Health/Function Pre 28 %    Socioeconomic Pre 29.25 %    Psych/Spiritual Pre 30 %    Family Pre 30 %     GLOBAL Pre 28.97 %              Nutrition & Weight - Outcomes:  Pre Biometrics - 10/17/20 1010       Pre Biometrics   Height 5' 9.7" (1.77 m)    Weight 178 lb 1.6 oz (80.8 kg)    BMI (Calculated) 25.79    Single Leg Stand 12.37 seconds              Nutrition:  Nutrition Therapy & Goals - 11/07/20 0839       Nutrition Therapy   Diet Heart healthy, low Na    Drug/Food Interactions Statins/Certain Fruits    Protein (specify units) 65g    Fiber 30 grams    Whole Grain Foods 3 servings    Saturated Fats 12 max. grams    Fruits and Vegetables 8 servings/day    Sodium 1.5 grams      Personal Nutrition Goals   Nutrition Goal ST: LT: maintain current changes made since stent post graduation    Comments Carlos Lopez scored a 60 on his PYP and reports having a generally healthy diet with lots of different fruits and vegetables, whole grains, and lean proteins like chicken. Carlos Lopez feels that his biggest barrier was flavor as he changed so much so fast, discussed ways to improve flavor and provided resources for him. Carlos Lopez is graduating today due to insurance so we will not be able to follow up. Reviewed education on herat healthy eatig from nutrition education last week, Carlos Lopez feels confident he knows what he should be doing, but would like ti to be more sustainable for him.      Intervention  Plan   Intervention Prescribe, educate and counsel regarding individualized specific dietary modifications aiming towards targeted core components such as weight, hypertension, lipid management, diabetes, heart failure and other comorbidities.    Expected Outcomes Short Term Goal: Understand basic principles of dietary content, such as calories, fat, sodium, cholesterol and nutrients.;Short Term Goal: A plan has been developed with personal nutrition goals set during dietitian appointment.;Long Term Goal: Adherence to prescribed nutrition plan.             Nutrition  Discharge:   Education Questionnaire Score:  Knowledge Questionnaire Score - 10/17/20 1009       Knowledge Questionnaire Score   Pre Score 22/26: Angina, Nutrition, Exercise             Goals reviewed with patient; copy given to patient.

## 2020-11-07 NOTE — Progress Notes (Signed)
Cardiac Individual Treatment Plan  Patient Details  Name: Carlos Lopez MRN: [id number] Date of Birth: December 12, 1960 Referring Provider:   Flowsheet Row Cardiac Rehab from 10/17/2020 in [facility] Cardiac and Pulmonary Rehab  Referring Provider Arnoldo Hooker MD       Initial Encounter Date:  Flowsheet Row Cardiac Rehab from 10/17/2020 in [facility] Cardiac and Pulmonary Rehab  Date 10/17/20       Visit Diagnosis: S/P AVR (aortic valve replacement)  Patient's Home Medications on Admission:  Current Outpatient Medications:    allopurinol (ZYLOPRIM) 300 MG tablet, Take 300 mg by mouth daily., Disp: , Rfl:    aspirin 81 MG chewable tablet, Chew 1 tablet (81 mg total) by mouth daily., Disp: 90 tablet, Rfl: 4   atorvastatin (LIPITOR) 80 MG tablet, Take 80 mg by mouth daily., Disp: , Rfl:    Cyanocobalamin (B-12 PO), Take 1 tablet by mouth daily., Disp: , Rfl:    furosemide (LASIX) 40 MG tablet, Take by mouth., Disp: , Rfl:    losartan-hydrochlorothiazide (HYZAAR) 100-12.5 MG tablet, Take 1 tablet by mouth daily. (Patient not taking: Reported on 10/04/2020), Disp: , Rfl:    metoprolol tartrate (LOPRESSOR) 25 MG tablet, Take by mouth., Disp: , Rfl:    pantoprazole (PROTONIX) 40 MG tablet, Take 40 mg by mouth daily., Disp: , Rfl:    potassium chloride SA (KLOR-CON) 20 MEQ tablet, Take by mouth., Disp: , Rfl:   Past Medical History: Past Medical History:  Diagnosis Date   Gout 07/07/2018   Hypertension     Tobacco Use: Social History   Tobacco Use  Smoking Status Never  Smokeless Tobacco Former    Labs: Recent Review Flowsheet Data   There is no flowsheet data to display.      Exercise Target Goals: Exercise Program Goal: Individual exercise prescription set using results from initial 6 min walk test and THRR while considering  patient's activity barriers and safety.   Exercise Prescription Goal: Initial exercise prescription builds to 30-45 minutes a day of aerobic activity,  2-3 days per week.  Home exercise guidelines will be given to patient during program as part of exercise prescription that the participant will acknowledge.   Education: Aerobic Exercise: - Group verbal and visual presentation on the components of exercise prescription. Introduces F.I.T.T principle from ACSM for exercise prescriptions.  Reviews F.I.T.T. principles of aerobic exercise including progression. Written material given at graduation.   Education: Resistance Exercise: - Group verbal and visual presentation on the components of exercise prescription. Introduces F.I.T.T principle from ACSM for exercise prescriptions  Reviews F.I.T.T. principles of resistance exercise including progression. Written material given at graduation.    Education: Exercise & Equipment Safety: - Individual verbal instruction and demonstration of equipment use and safety with use of the equipment. Flowsheet Row Cardiac Rehab from 11/06/2020 in [facility] Cardiac and Pulmonary Rehab  Education need identified 10/17/20  Date 10/17/20  Educator KL  Instruction Review Code 1- Verbalizes Understanding       Education: Exercise Physiology & General Exercise Guidelines: - Group verbal and written instruction with models to review the exercise physiology of the cardiovascular system and associated critical values. Provides general exercise guidelines with specific guidelines to those with heart or lung disease.  Flowsheet Row Cardiac Rehab from 11/06/2020 in [facility] Cardiac and Pulmonary Rehab  Education need identified 10/17/20       Education: Flexibility, Balance, Mind/Body Relaxation: - Group verbal and visual presentation with interactive activity on the components of exercise prescription. Introduces F.I.T.T  principle from ACSM for exercise prescriptions. Reviews F.I.T.T. principles of flexibility and balance exercise training including progression. Also discusses the mind body connection.  Reviews various relaxation  techniques to help reduce and manage stress (i.e. Deep breathing, progressive muscle relaxation, and visualization). Balance handout provided to take home. Written material given at graduation.   Activity Barriers & Risk Stratification:  Activity Barriers & Cardiac Risk Stratification - 10/17/20 1147       Activity Barriers & Cardiac Risk Stratification   Activity Barriers Other (comment)    Comments Right knee- arthritis    Cardiac Risk Stratification Moderate             6 Minute Walk:  6 Minute Walk     Row Name 10/17/20 1151         6 Minute Walk   Phase Initial     Distance 1490 feet     Walk Time 6 minutes     # of Rest Breaks 0     MPH 2.82     METS 4.16     RPE 7     Perceived Dyspnea  0     VO2 Peak 14.56     Symptoms No     Resting HR 59 bpm     Resting BP 152/88     Resting Oxygen Saturation  98 %     Exercise Oxygen Saturation  during 6 min walk 95 %     Max Ex. HR 98 bpm     Max Ex. BP 158/84     2 Minute Post BP 150/86              Oxygen Initial Assessment:   Oxygen Re-Evaluation:   Oxygen Discharge (Final Oxygen Re-Evaluation):   Initial Exercise Prescription:  Initial Exercise Prescription - 10/17/20 1100       Date of Initial Exercise RX and Referring Provider   Date 10/17/20    Referring Provider Arnoldo Hooker MD      Treadmill   MPH 2.8    Grade 1    Minutes 15    METs 3.53      Recumbant Bike   Level 3    RPM 60    Watts 35    Minutes 15    METs 4      REL-XR   Level 3    Speed 50    Minutes 15    METs 4      T5 Nustep   Level 3    SPM 80    Minutes 15    METs 4      Prescription Details   Frequency (times per week) 3    Duration Progress to 30 minutes of continuous aerobic without signs/symptoms of physical distress      Intensity   THRR 40-80% of Max Heartrate 99-140    Ratings of Perceived Exertion 11-13    Perceived Dyspnea 0-4      Progression   Progression Continue to progress workloads  to maintain intensity without signs/symptoms of physical distress.      Resistance Training   Training Prescription Yes    Weight 4 lb    Reps 10-15             Perform Capillary Blood Glucose checks as needed.  Exercise Prescription Changes:   Exercise Prescription Changes     Row Name 10/17/20 1100 10/23/20 1700 11/05/20 1400         Response to Exercise  Blood Pressure (Admit) 152/88 -- 142/80     Blood Pressure (Exercise) 158/84 -- 152/82     Blood Pressure (Exit) 150/86 -- 120/80     Heart Rate (Admit) 59 bpm -- 70 bpm     Heart Rate (Exercise) 98 bpm -- 110 bpm     Heart Rate (Exit) 63 bpm -- 84 bpm     Oxygen Saturation (Admit) 98 % -- --     Oxygen Saturation (Exercise) 95 % -- --     Oxygen Saturation (Exit) 99 % -- --     Rating of Perceived Exertion (Exercise) 7 -- 12     Perceived Dyspnea (Exercise) 0 -- --     Symptoms none -- none     Comments walk test results -- --     Duration -- -- Progress to 30 minutes of  aerobic without signs/symptoms of physical distress     Intensity -- -- THRR unchanged           Progression   Progression -- -- Continue to progress workloads to maintain intensity without signs/symptoms of physical distress.     Average METs -- -- 3.17           Resistance Training   Training Prescription -- -- Yes     Weight -- -- 4 lb     Reps -- -- 10-15           Treadmill   MPH -- -- 3     Grade -- -- 3     Minutes -- -- 15     METs -- -- 4.54           REL-XR   Level -- -- 1     Speed -- -- 50     Minutes -- -- 15     METs -- -- 1.8           Home Exercise Plan   Plans to continue exercise at -- Home (comment)  Treadmill, recumbant bike, walking Home (comment)  Treadmill, recumbant bike, walking     Frequency -- Add 2 additional days to program exercise sessions. Add 2 additional days to program exercise sessions.     Initial Home Exercises Provided -- 10/23/20 10/23/20              Exercise Comments:    Exercise Comments     Row Name 10/21/20 1541           Exercise Comments First full day of exercise!  Patient was oriented to gym and equipment including functions, settings, policies, and procedures.  Patient's individual exercise prescription and treatment plan were reviewed.  All starting workloads were established based on the results of the 6 minute walk test done at initial orientation visit.  The plan for exercise progression was also introduced and progression will be customized based on patient's performance and goals.                Exercise Goals and Review:   Exercise Goals     Row Name 10/17/20 1201             Exercise Goals   Increase Physical Activity Yes       Intervention Provide advice, education, support and counseling about physical activity/exercise needs.;Develop an individualized exercise prescription for aerobic and resistive training based on initial evaluation findings, risk stratification, comorbidities and participant's personal goals.       Expected Outcomes Short Term: Attend rehab on a regular  basis to increase amount of physical activity.;Long Term: Add in home exercise to make exercise part of routine and to increase amount of physical activity.;Long Term: Exercising regularly at least 3-5 days a week.       Increase Strength and Stamina Yes       Intervention Provide advice, education, support and counseling about physical activity/exercise needs.;Develop an individualized exercise prescription for aerobic and resistive training based on initial evaluation findings, risk stratification, comorbidities and participant's personal goals.       Expected Outcomes Short Term: Increase workloads from initial exercise prescription for resistance, speed, and METs.;Short Term: Perform resistance training exercises routinely during rehab and add in resistance training at home;Long Term: Improve cardiorespiratory fitness, muscular endurance and strength as  measured by increased METs and functional capacity ( )       Able to understand and use rate of perceived exertion (RPE) scale Yes       Intervention Provide education and explanation on how to use RPE scale       Expected Outcomes Short Term: Able to use RPE daily in rehab to express subjective intensity level;Long Term:  Able to use RPE to guide intensity level when exercising independently       Able to understand and use Dyspnea scale Yes       Intervention Provide education and explanation on how to use Dyspnea scale       Expected Outcomes Short Term: Able to use Dyspnea scale daily in rehab to express subjective sense of shortness of breath during exertion;Long Term: Able to use Dyspnea scale to guide intensity level when exercising independently       Knowledge and understanding of Target Heart Rate Range (THRR) Yes       Intervention Provide education and explanation of THRR including how the numbers were predicted and where they are located for reference       Expected Outcomes Short Term: Able to state/look up THRR;Long Term: Able to use THRR to govern intensity when exercising independently;Short Term: Able to use daily as guideline for intensity in rehab       Able to check pulse independently Yes       Intervention Provide education and demonstration on how to check pulse in carotid and radial arteries.;Review the importance of being able to check your own pulse for safety during independent exercise       Expected Outcomes Short Term: Able to explain why pulse checking is important during independent exercise;Long Term: Able to check pulse independently and accurately       Understanding of Exercise Prescription Yes       Intervention Provide education, explanation, and written materials on patient's individual exercise prescription       Expected Outcomes Short Term: Able to explain program exercise prescription;Long Term: Able to explain home exercise prescription to exercise  independently                Exercise Goals Re-Evaluation :  Exercise Goals Re-Evaluation     Row Name 10/21/20 1541 10/23/20 1725 11/05/20 1452         Exercise Goal Re-Evaluation   Exercise Goals Review Increase Physical Activity;Able to understand and use rate of perceived exertion (RPE) scale;Knowledge and understanding of Target Heart Rate Range (THRR);Understanding of Exercise Prescription;Increase Strength and Stamina;Able to understand and use Dyspnea scale;Able to check pulse independently Increase Physical Activity;Understanding of Exercise Prescription;Increase Strength and Stamina Increase Physical Activity;Increase Strength and Stamina     Comments Reviewed  RPE and dyspnea scales, THR and program prescription with pt today.  Pt voiced understanding and was given a copy of goals to take home. Reviewed home exercise with pt today.  Pt plans to exercise at home. Carlos Lopez has a treadmill, recumbant bike at home  Reviewed THR, pulse, RPE, sign and symptoms, pulse oximetery and when to call 911 or MD.  Also discussed weather considerations and indoor options.  Pt voiced understanding. Carlos Lopez is progressing well and has increased levels on TM.  He has also increased levels on XR.  Staff will continue to monitor progress.     Expected Outcomes Short: Use RPE daily to regulate intensity. Long: Follow program prescription in THR. Short: Add 1 day of exercise at home Long: Continue to exercise independently at home at appropriate prescription Short: attend consistently Long: build overall stamina              Discharge Exercise Prescription (Final Exercise Prescription Changes):  Exercise Prescription Changes - 11/05/20 1400       Response to Exercise   Blood Pressure (Admit) 142/80    Blood Pressure (Exercise) 152/82    Blood Pressure (Exit) 120/80    Heart Rate (Admit) 70 bpm    Heart Rate (Exercise) 110 bpm    Heart Rate (Exit) 84 bpm    Rating of Perceived Exertion  (Exercise) 12    Symptoms none    Duration Progress to 30 minutes of  aerobic without signs/symptoms of physical distress    Intensity THRR unchanged      Progression   Progression Continue to progress workloads to maintain intensity without signs/symptoms of physical distress.    Average METs 3.17      Resistance Training   Training Prescription Yes    Weight 4 lb    Reps 10-15      Treadmill   MPH 3    Grade 3    Minutes 15    METs 4.54      REL-XR   Level 1    Speed 50    Minutes 15    METs 1.8      Home Exercise Plan   Plans to continue exercise at Home (comment)   Treadmill, recumbant bike, walking   Frequency Add 2 additional days to program exercise sessions.    Initial Home Exercises Provided 10/23/20             Nutrition:  Target Goals: Understanding of nutrition guidelines, daily intake of sodium 1500mg , cholesterol 200mg , calories 30% from fat and 7% or less from saturated fats, daily to have 5 or more servings of fruits and vegetables.  Education: All About Nutrition: -Group instruction provided by verbal, written material, interactive activities, discussions, models, and posters to present general guidelines for heart healthy nutrition including fat, fiber, MyPlate, the role of sodium in heart healthy nutrition, utilization of the nutrition label, and utilization of this knowledge for meal planning. Follow up email sent as well. Written material given at graduation. Flowsheet Row Cardiac Rehab from 11/06/2020 in [facility] Cardiac and Pulmonary Rehab  Education need identified 10/17/20  Date 10/30/20  Educator MC  Instruction Review Code 1- Verbalizes Understanding       Biometrics:  Pre Biometrics - 10/17/20 1010       Pre Biometrics   Height 5' 9.7" (1.77 m)    Weight 178 lb 1.6 oz (80.8 kg)    BMI (Calculated) 25.79    Single Leg Stand 12.37 seconds  Nutrition Therapy Plan and Nutrition Goals:  Nutrition Therapy & Goals -  11/07/20 0839       Nutrition Therapy   Diet Heart healthy, low Na    Drug/Food Interactions Statins/Certain Fruits    Protein (specify units) 65g    Fiber 30 grams    Whole Grain Foods 3 servings    Saturated Fats 12 max. grams    Fruits and Vegetables 8 servings/day    Sodium 1.5 grams      Personal Nutrition Goals   Nutrition Goal ST: LT: maintain current changes made since stent post graduation    Comments Carlos Lopez scored a 60 on his PYP and reports having a generally healthy diet with lots of different fruits and vegetables, whole grains, and lean proteins like chicken. Carlos Lopez feels that his biggest barrier was flavor as he changed so much so fast, discussed ways to improve flavor and provided resources for him. Carlos Lopez is graduating today due to insurance so we will not be able to follow up. Reviewed education on herat healthy eatig from nutrition education last week, Carlos Lopez feels confident he knows what he should be doing, but would like ti to be more sustainable for him.      Intervention Plan   Intervention Prescribe, educate and counsel regarding individualized specific dietary modifications aiming towards targeted core components such as weight, hypertension, lipid management, diabetes, heart failure and other comorbidities.    Expected Outcomes Short Term Goal: Understand basic principles of dietary content, such as calories, fat, sodium, cholesterol and nutrients.;Short Term Goal: A plan has been developed with personal nutrition goals set during dietitian appointment.;Long Term Goal: Adherence to prescribed nutrition plan.             Nutrition Assessments:  MEDIFICTS Score Key: ?70 Need to make dietary changes  40-70 Heart Healthy Diet ? 40 Therapeutic Level Cholesterol Diet  Flowsheet Row Cardiac Rehab from 10/17/2020 in [facility] Cardiac and Pulmonary Rehab  Picture Your Plate Total Score on Admission 60      Picture Your Plate Scores: <10 Unhealthy dietary  pattern with much room for improvement. 41-50 Dietary pattern unlikely to meet recommendations for good health and room for improvement. 51-60 More healthful dietary pattern, with some room for improvement.  >60 Healthy dietary pattern, although there may be some specific behaviors that could be improved.    Nutrition Goals Re-Evaluation:   Nutrition Goals Discharge (Final Nutrition Goals Re-Evaluation):   Psychosocial: Target Goals: Acknowledge presence or absence of significant depression and/or stress, maximize coping skills, provide positive support system. Participant is able to verbalize types and ability to use techniques and skills needed for reducing stress and depression.   Education: Stress, Anxiety, and Depression - Group verbal and visual presentation to define topics covered.  Reviews how body is impacted by stress, anxiety, and depression.  Also discusses healthy ways to reduce stress and to treat/manage anxiety and depression.  Written material given at graduation.   Education: Sleep Hygiene -Provides group verbal and written instruction about how sleep can affect your health.  Define sleep hygiene, discuss sleep cycles and impact of sleep habits. Review good sleep hygiene tips.    Initial Review & Psychosocial Screening:  Initial Psych Review & Screening - 10/04/20 1417       Initial Review   Current issues with Current Stress Concerns    Source of Stress Concerns Occupation      Family Dynamics   Good Support System? Yes   wife, friends  Barriers   Psychosocial barriers to participate in program There are no identifiable barriers or psychosocial needs.;The patient should benefit from training in stress management and relaxation.      Screening Interventions   Interventions Encouraged to exercise;Provide feedback about the scores to participant;To provide support and resources with identified psychosocial needs    Expected Outcomes Short Term goal: Utilizing  psychosocial counselor, staff and physician to assist with identification of specific Stressors or current issues interfering with healing process. Setting desired goal for each stressor or current issue identified.;Long Term Goal: Stressors or current issues are controlled or eliminated.;Short Term goal: Identification and review with participant of any Quality of Life or Depression concerns found by scoring the questionnaire.;Long Term goal: The participant improves quality of Life and PHQ9 Scores as seen by post scores and/or verbalization of changes             Quality of Life Scores:   Quality of Life - 10/17/20 1204       Quality of Life   Select Quality of Life      Quality of Life Scores   Health/Function Pre 28 %    Socioeconomic Pre 29.25 %    Psych/Spiritual Pre 30 %    Family Pre 30 %    GLOBAL Pre 28.97 %            Scores of 19 and below usually indicate a poorer quality of life in these areas.  A difference of  2-3 points is a clinically meaningful difference.  A difference of 2-3 points in the total score of the Quality of Life Index has been associated with significant improvement in overall quality of life, self-image, physical symptoms, and general health in studies assessing change in quality of life.  PHQ-9: Recent Review Flowsheet Data     Depression screen [facility] 2/9 10/17/2020   Decreased Interest 0   Down, Depressed, Hopeless 0   PHQ - 2 Score 0   Altered sleeping 1   Tired, decreased energy 1   Change in appetite 0   Feeling bad or failure about yourself  0   Trouble concentrating 0   Moving slowly or fidgety/restless 0   Suicidal thoughts 0   PHQ-9 Score 2   Difficult doing work/chores Not difficult at all      Interpretation of Total Score  Total Score Depression Severity:  1-4 = Minimal depression, 5-9 = Mild depression, 10-14 = Moderate depression, 15-19 = Moderately severe depression, 20-27 = Severe depression   Psychosocial Evaluation and  Intervention:  Psychosocial Evaluation - 10/04/20 1421       Psychosocial Evaluation & Interventions   Interventions Encouraged to exercise with the program and follow exercise prescription    Comments Carlos Lopez reports feeling well post AV replacement. He is taking it easy while he heals, but is ready to get back to his normal lifestyle. He plans on returning to work sometime in October. His biggest stressor is related to his insurance. His company is switching insurances the end of September and it is causing complications on coverage while he is still out from surgery. He will only be able to attend the program until the end of September when the insurances switch. He is hopeful to be able to work hard and learn how to maintain a heart healthy lifestyle. His wife and friends are very supportive of his recovery.    Expected Outcomes Short: attend cardiac rehab for education and exercise. Long: develop and maintain  positive self care habits             Psychosocial Re-Evaluation:   Psychosocial Discharge (Final Psychosocial Re-Evaluation):   Vocational Rehabilitation: Provide vocational rehab assistance to qualifying candidates.   Vocational Rehab Evaluation & Intervention:  Vocational Rehab - 10/04/20 1416       Initial Vocational Rehab Evaluation & Intervention   Assessment shows need for Vocational Rehabilitation No             Education: Education Goals: Education classes will be provided on a variety of topics geared toward better understanding of heart health and risk factor modification. Participant will state understanding/return demonstration of topics presented as noted by education test scores.  Learning Barriers/Preferences:  Learning Barriers/Preferences - 10/04/20 1416       Learning Barriers/Preferences   Learning Barriers None    Learning Preferences None             General Cardiac Education Topics:  AED/CPR: - Group verbal and written  instruction with the use of models to demonstrate the basic use of the AED with the basic ABC's of resuscitation.   Anatomy and Cardiac Procedures: - Group verbal and visual presentation and models provide information about basic cardiac anatomy and function. Reviews the testing methods done to diagnose heart disease and the outcomes of the test results. Describes the treatment choices: Medical Management, Angioplasty, or Coronary Bypass Surgery for treating various heart conditions including Myocardial Infarction, Angina, Valve Disease, and Cardiac Arrhythmias.  Written material given at graduation.   Medication Safety: - Group verbal and visual instruction to review commonly prescribed medications for heart and lung disease. Reviews the medication, class of the drug, and side effects. Includes the steps to properly store meds and maintain the prescription regimen.  Written material given at graduation. Flowsheet Row Cardiac Rehab from 11/06/2020 in [facility] Cardiac and Pulmonary Rehab  Date 10/23/20  Educator KB  Instruction Review Code 1- Verbalizes Understanding       Intimacy: - Group verbal instruction through game format to discuss how heart and lung disease can affect sexual intimacy. Written material given at graduation..   Know Your Numbers and Heart Failure: - Group verbal and visual instruction to discuss disease risk factors for cardiac and pulmonary disease and treatment options.  Reviews associated critical values for Overweight/Obesity, Hypertension, Cholesterol, and Diabetes.  Discusses basics of heart failure: signs/symptoms and treatments.  Introduces Heart Failure Zone chart for action plan for heart failure.  Written material given at graduation. Flowsheet Row Cardiac Rehab from 11/06/2020 in [facility] Cardiac and Pulmonary Rehab  Date 11/06/20  Educator KB  Instruction Review Code 1- Verbalizes Understanding       Infection Prevention: - Provides verbal and written material  to individual with discussion of infection control including proper hand washing and proper equipment cleaning during exercise session. Flowsheet Row Cardiac Rehab from 11/06/2020 in [facility] Cardiac and Pulmonary Rehab  Education need identified 10/17/20  Date 10/17/20  Educator KL  Instruction Review Code 1- Verbalizes Understanding       Falls Prevention: - Provides verbal and written material to individual with discussion of falls prevention and safety. Flowsheet Row Cardiac Rehab from 11/06/2020 in [facility] Cardiac and Pulmonary Rehab  Education need identified 10/17/20  Date 10/17/20  Educator KL  Instruction Review Code 1- Verbalizes Understanding       Other: -Provides group and verbal instruction on various topics (see comments)   Knowledge Questionnaire Score:  Knowledge Questionnaire Score - 10/17/20 1009  Knowledge Questionnaire Score   Pre Score 22/26: Angina, Nutrition, Exercise             Core Components/Risk Factors/Patient Goals at Admission:  Personal Goals and Risk Factors at Admission - 10/17/20 1202       Core Components/Risk Factors/Patient Goals on Admission    Weight Management Yes;Weight Maintenance   Per patient   Intervention Weight Management: Develop a combined nutrition and exercise program designed to reach desired caloric intake, while maintaining appropriate intake of nutrient and fiber, sodium and fats, and appropriate energy expenditure required for the weight goal.;Weight Management: Provide education and appropriate resources to help participant work on and attain dietary goals.;Weight Management/Obesity: Establish reasonable short term and long term weight goals.    Admit Weight 178 lb (80.7 kg)    Goal Weight: Short Term 178 lb (80.7 kg)    Goal Weight: Long Term 178 lb (80.7 kg)    Expected Outcomes Short Term: Continue to assess and modify interventions until short term weight is achieved;Long Term: Adherence to nutrition and  physical activity/exercise program aimed toward attainment of established weight goal;Weight Maintenance: Understanding of the daily nutrition guidelines, which includes 25-35% calories from fat, 7% or less cal from saturated fats, less than 200mg  cholesterol, less than 1.5gm of sodium, & 5 or more servings of fruits and vegetables daily;Understanding recommendations for meals to include 15-35% energy as protein, 25-35% energy from fat, 35-60% energy from carbohydrates, less than 200mg  of dietary cholesterol, 20-35 gm of total fiber daily;Understanding of distribution of calorie intake throughout the day with the consumption of 4-5 meals/snacks    Hypertension Yes    Intervention Provide education on lifestyle modifcations including regular physical activity/exercise, weight management, moderate sodium restriction and increased consumption of fresh fruit, vegetables, and low fat dairy, alcohol moderation, and smoking cessation.;Monitor prescription use compliance.    Expected Outcomes Short Term: Continued assessment and intervention until BP is < 140/67mm HG in hypertensive participants. < 130/50mm HG in hypertensive participants with diabetes, heart failure or chronic kidney disease.;Long Term: Maintenance of blood pressure at goal levels.    Lipids Yes    Intervention Provide education and support for participant on nutrition & aerobic/resistive exercise along with prescribed medications to achieve LDL 70mg , HDL >40mg .    Expected Outcomes Short Term: Participant states understanding of desired cholesterol values and is compliant with medications prescribed. Participant is following exercise prescription and nutrition guidelines.;Long Term: Cholesterol controlled with medications as prescribed, with individualized exercise RX and with personalized nutrition plan. Value goals: LDL < 70mg , HDL > 40 mg.             Education:Diabetes - Individual verbal and written instruction to review  signs/symptoms of diabetes, desired ranges of glucose level fasting, after meals and with exercise. Acknowledge that pre and post exercise glucose checks will be done for 3 sessions at entry of program.   Core Components/Risk Factors/Patient Goals Review:    Core Components/Risk Factors/Patient Goals at Discharge (Final Review):    ITP Comments:  ITP Comments     Row Name 10/04/20 1412 10/17/20 1008 10/21/20 1541 11/07/20 0837 11/07/20 1353   ITP Comments Initial telephone orientation completed. Diagnosis can be found in [facility] 8/4. EP orientation scheduled for Thursday 9/8 at 8am. Completed and gym orientation. Initial ITP created and sent for review to Dr. Vida Rigger, Medical Director. First full day of exercise!  Patient was oriented to gym and equipment including functions, settings, policies, and procedures.  Patient's individual  exercise prescription and treatment plan were reviewed.  All starting workloads were established based on the results of the 6 minute walk test done at initial orientation visit.  The plan for exercise progression was also introduced and progression will be customized based on patient's performance and goals. Completed initial nutrition consultation Zedrick graduated today from  rehab with 12 sessions completed.  Details of the patient's exercise prescription and what He needs to do in order to continue the prescription and progress were discussed with patient.  Patient was given a copy of prescription and goals.  Patient verbalized understanding.  Carlos Lopez plans to continue to exercise by attending a gym.            Comments: Discharge ITP

## 2020-11-07 NOTE — Progress Notes (Signed)
Daily Session Note  Patient Details  Name: Carlos Lopez MRN: [id number] Date of Birth: [birth year] Referring Provider:   Flowsheet Row Cardiac Rehab from 10/17/2020 in [facility] Cardiac and Pulmonary Rehab  Referring Provider Serafina Royals MD       Encounter Date: 11/07/2020  Check In:  Session Check In - 11/07/20 0737       Check-In   Staff Present Nyoka Cowden, RN, BSN, [place], BS, ACSM CEP, Exercise Physiologist;Amanda Oletta Darter, [place], ACSM CEP, Exercise Physiologist    Virtual Visit No    Medication changes reported     No    Fall or balance concerns reported    No    Tobacco Cessation No Change    Warm-up and Cool-down Performed on first and last piece of equipment    Resistance Training Performed Yes    VAD Patient? No    PAD/SET Patient? No      Pain Assessment   Currently in Pain? No/denies                Social History   Tobacco Use  Smoking Status Never  Smokeless Tobacco Former    Goals Met:  Independence with exercise equipment Exercise tolerated well No report of concerns or symptoms today  Goals Unmet:  Not Applicable  Comments: Pt able to follow exercise prescription today without complaint.  Will continue to monitor for progression.    Dr. Emily Filbert is Medical Director for [place].  Dr. Ottie Glazier is Medical Director for [facility] Pulmonary Rehabilitation.

## 2020-11-07 NOTE — Progress Notes (Signed)
Completed initial nutrition consultation.   

## 2021-10-10 ENCOUNTER — Ambulatory Visit: Admit: 2021-10-10 | Payer: 59

## 2021-10-10 SURGERY — COLONOSCOPY WITH PROPOFOL
Anesthesia: General

## 2022-06-18 ENCOUNTER — Ambulatory Visit: Payer: BC Managed Care – PPO | Attending: Internal Medicine | Admitting: Internal Medicine

## 2022-06-18 ENCOUNTER — Encounter: Payer: Self-pay | Admitting: Internal Medicine

## 2022-06-18 VITALS — BP 136/86 | HR 75 | Ht 70.0 in | Wt 191.8 lb

## 2022-06-18 DIAGNOSIS — Z8774 Personal history of (corrected) congenital malformations of heart and circulatory system: Secondary | ICD-10-CM | POA: Diagnosis not present

## 2022-06-18 DIAGNOSIS — E785 Hyperlipidemia, unspecified: Secondary | ICD-10-CM

## 2022-06-18 DIAGNOSIS — I10 Essential (primary) hypertension: Secondary | ICD-10-CM

## 2022-06-18 DIAGNOSIS — I340 Nonrheumatic mitral (valve) insufficiency: Secondary | ICD-10-CM | POA: Diagnosis not present

## 2022-06-18 DIAGNOSIS — Z952 Presence of prosthetic heart valve: Secondary | ICD-10-CM | POA: Diagnosis not present

## 2022-06-18 DIAGNOSIS — Z8679 Personal history of other diseases of the circulatory system: Secondary | ICD-10-CM | POA: Diagnosis not present

## 2022-06-18 DIAGNOSIS — I2510 Atherosclerotic heart disease of native coronary artery without angina pectoris: Secondary | ICD-10-CM

## 2022-06-18 NOTE — Patient Instructions (Signed)
Medication Instructions:  Your Physician recommend you continue on your current medication as directed.     *If you need a refill on your cardiac medications before your next appointment, please call your pharmacy*   Lab Work: None ordered today   Testing/Procedures: Your physician has requested that you have an echocardiogram. Echocardiography is a painless test that uses sound waves to create images of your heart. It provides your doctor with information about the size and shape of your heart and how well your heart's chambers and valves are working.   You may receive an ultrasound enhancing agent through an IV if needed to better visualize your heart during the echo. This procedure takes approximately one hour.  There are no restrictions for this procedure.  This will take place at [address] ([facility] [address], [place] [address]    Follow-Up: At [facility], you and your health needs are our priority.  As part of our continuing mission to provide you with exceptional heart care, we have created designated Provider Care Teams.  These Care Teams include your primary Cardiologist (physician) and Advanced Practice Providers (APPs -  Physician Assistants and Nurse Practitioners) who all work together to provide you with the care you need, when you need it.  We recommend signing up for the patient portal called "MyChart".  Sign up information is provided on this After Visit Summary.  MyChart is used to connect with patients for Virtual Visits (Telemedicine).  Patients are able to view lab/test results, encounter notes, upcoming appointments, etc.  Non-urgent messages can be sent to your provider as well.   To learn more about what you can do with MyChart, go to [email or web address].    Your next appointment:   6 month(s)  Provider:   You may see Christopher End, MD or one of the following Advanced Practice Providers on your designated Care Team:    Christopher Berge, NP Ryan Dunn, PA-C Cadence Furth, PA-C Sheri Hammock, NP     

## 2022-06-18 NOTE — Progress Notes (Signed)
New Outpatient Visit Date: 06/18/2022  Referring Provider: Gerlene Fee, PA-C [address] [place],  [place] [address]  Chief Complaint: Establish cardiology care  HPI:  Carlos Lopez is a 62 y.o. male who is being seen today to establish cardiology care at the request of Ms. Steiner. He has a history of coronary artery disease status post PCI to the RCA (07/07/2018), bicuspid aortic valve with severe aortic stenosis status post bioprosthetic aortic valve replacement (09/13/2020), hypertension, hyperlipidemia, GERD, gout, and kidney stones.  He was previously followed by Dr. Gwen Pounds but has been referred to [place] for ongoing cardiology care.  Today, Carlos Lopez reports that he feels fairly well.  He notes some shortness of breath when he bends over but otherwise is without dyspnea including when active.  He also denies chest pain, palpitations, lightheadedness, edema, and focal neurologic changes.  Home blood pressures are typically in the 130s over 80s.  --------------------------------------------------------------------------------------------------  Cardiovascular History & Procedures: Cardiovascular Problems: Coronary artery disease status post PCI to RCA Bicuspid aortic valve status post bioprosthetic AVR  Risk Factors: Known coronary artery disease, hypertension, hyperlipidemia, male gender, and age greater than 63  Cath/PCI: R/LHC (08/14/2020): LMCA normal.  LAD normal.  LCx with 80% mid vessel stenosis.  RCA with patent distal vessel stent and 40% stenosis at the proximal edge.  LVEF 65%.  Normal right heart pressures. LHC/PCI (07/07/2018): LMCA normal.  LAD normal.  LCx with 40% mid vessel stenosis.  Dominant RCA with up to 99% distal RCA disease.  Successful PCI to distal RCA with synergy 2.25 x 32 mm DES.  CV Surgery: Aortic valve replacement (09/13/2021, [facility]): 23 mm Inspiris Resilia bioprosthetic valve  EP Procedures and Devices: None  Non-Invasive  Evaluation(s): Exercise MPI (12/13/2020): Low risk study without evidence of ischemia or scar.  Patient exercised for 9 minutes. TTE (11/08/2020): Normal biventricular systolic function.  Well-seated aortic valve prosthesis with normal function.  Moderate mitral and tricuspid regurgitation.  Recent CV Pertinent Labs: Lab Results  Component Value Date   K 4.1 07/08/2018   BUN 22 (H) 07/08/2018   CREATININE 0.89 01/23/2020    --------------------------------------------------------------------------------------------------  Past Medical History:  Diagnosis Date   CAD (coronary artery disease)    Gout 07/07/2018   Gout    Hyperlipidemia    Hypertension    LVH (left ventricular hypertrophy)    Stable angina     Past Surgical History:  Procedure Laterality Date   aortic valve replacement with bioprosthetic valve     Aortic Valve Replacment (32 mm Inspiris bioprosthetic) performed on 09/13/2020 by Dr. Selmer Dominion   CORONARY STENT INTERVENTION N/A 07/07/2018   Procedure: CORONARY STENT INTERVENTION;  Surgeon: Marcina Millard, MD;  Location: [facility] INVASIVE CV LAB;  Service: Cardiovascular;  Laterality: N/A;  RCA   NECK SURGERY Right    RIGHT/LEFT HEART CATH AND CORONARY ANGIOGRAPHY N/A 07/07/2018   Procedure: RIGHT/LEFT HEART CATH AND CORONARY ANGIOGRAPHY;  Surgeon: Lamar Blinks, MD;  Location: [facility] INVASIVE CV LAB;  Service: Cardiovascular;  Laterality: N/A;   RIGHT/LEFT HEART CATH AND CORONARY ANGIOGRAPHY N/A 08/14/2020   Procedure: RIGHT/LEFT HEART CATH AND CORONARY ANGIOGRAPHY;  Surgeon: Lamar Blinks, MD;  Location: [facility] INVASIVE CV LAB;  Service: Cardiovascular;  Laterality: N/A;   SHOULDER SURGERY Right     Current Meds  Medication Sig   allopurinol (ZYLOPRIM) 300 MG tablet Take 300 mg by mouth daily.   aspirin 81 MG chewable tablet Chew 1 tablet (81 mg total) by mouth daily.  atorvastatin (LIPITOR) 80 MG tablet Take 80 mg by mouth daily.   Cyanocobalamin (B-12 PO)  Take 1 tablet by mouth daily.   indomethacin (INDOCIN) 50 MG capsule Take 50 mg by mouth 3 (three) times daily as needed.   losartan-hydrochlorothiazide (HYZAAR) 100-12.5 MG tablet Take 1 tablet by mouth daily.   meloxicam (MOBIC) 15 MG tablet Take 15 mg by mouth daily as needed for pain.   methocarbamol (ROBAXIN) 500 MG tablet Take 1 tablet by mouth every 8 (eight) hours as needed.   pantoprazole (PROTONIX) 40 MG tablet Take 40 mg by mouth daily.   potassium chloride SA (KLOR-CON M) 20 MEQ tablet Take 1 tablet by mouth daily.   potassium chloride SA (KLOR-CON) 20 MEQ tablet Take by mouth.    Allergies: Patient has no known allergies.  Social History   Tobacco Use   Smoking status: Never   Smokeless tobacco: Former  Building services engineer Use: Never used  Substance Use Topics   Alcohol use: Never   Drug use: Never    Family History  Problem Relation Age of Onset   Heart disease Neg Hx     Review of Systems: A 12-system review of systems was performed and was negative except as noted in the HPI.  --------------------------------------------------------------------------------------------------  Physical Exam: BP 136/86 (BP Location: Left Arm, Patient Position: Sitting, Cuff Size: Normal)   Pulse 75   Ht 5\' 10"  (1.778 m)   Wt 191 lb 12.8 oz (87 kg)   SpO2 96%   BMI 27.52 kg/m   General: NAD.  Accompanied by his wife. HEENT: No conjunctival pallor or scleral icterus. Neck: Supple without lymphadenopathy, thyromegaly, JVD, or HJR. No carotid bruit. Lungs: Normal work of breathing. Clear to auscultation bilaterally without wheezes or crackles. Heart: Regular rate and rhythm with 2/6 systolic murmur.  No rubs or gallops. Abd: Bowel sounds present. Soft, NT/ND without hepatosplenomegaly Ext: No lower extremity edema. Radial, PT, and DP pulses are 2+ bilaterally Skin: Warm and dry without rash. Neuro: CNIII-XII intact. Strength and fine-touch sensation intact in upper and  lower extremities bilaterally. Psych: Normal mood and affect.  EKG: Normal sinus rhythm with lateral T wave inversions and nonspecific ST segment changes.  Compared to prior tracing from 07/08/2018, lateral precordial T wave inversions have resolved.  Lateral T wave inversions and nonspecific ST changes are more now present.  Lab Results  Component Value Date   WBC 13.9 (H) 07/08/2018   HGB 14.1 07/08/2018   HCT 41.8 07/08/2018   MCV 87.4 07/08/2018   PLT 163 07/08/2018    Lab Results  Component Value Date   NA 140 07/08/2018   K 4.1 07/08/2018   CL 106 07/08/2018   CO2 27 07/08/2018   BUN 22 (H) 07/08/2018   CREATININE 0.89 01/23/2020   GLUCOSE 127 (H) 07/08/2018    --------------------------------------------------------------------------------------------------  ASSESSMENT AND PLAN: Coronary artery disease: No angina reported.  EKG shows some subtle lateral EKG changes that are slightly different compared to 2020.  However, in the absence of angina, we will defer repeat ischemia evaluation pending echocardiogram.  Continue aspirin and atorvastatin for secondary prevention.  Bicuspid aortic valve and severe aortic stenosis status post bioprosthetic aortic valve replacement: No symptoms of decompensated heart failure.  Obtain echocardiogram at patient's convenience.  Continue aspirin and SBE prophylaxis.  Mitral regurgitation: Moderate mitral regurgitation noted on last echo in 2022.  Given shortness of breath when bending over, we have agreed to repeat echo to ensure this  has not progressed.  Continue blood pressure control.  Hypertension: Blood pressure borderline elevated today.  Continue current doses of losartan and HCTZ.  Continue home blood pressure monitoring.  Hyperlipidemia: Continue atorvastatin 80 mg daily.  Follow-up: Return to clinic in 6 months.  Yvonne Kendall, MD 06/18/2022 2:13 PM

## 2022-08-04 ENCOUNTER — Ambulatory Visit: Payer: BC Managed Care – PPO | Attending: Internal Medicine

## 2022-08-04 DIAGNOSIS — Z952 Presence of prosthetic heart valve: Secondary | ICD-10-CM

## 2022-08-04 DIAGNOSIS — I340 Nonrheumatic mitral (valve) insufficiency: Secondary | ICD-10-CM

## 2022-08-04 LAB — ECHOCARDIOGRAM COMPLETE
AR max vel: 1.58 cm2
AV Area VTI: 1.7 cm2
AV Area mean vel: 1.64 cm2
AV Mean grad: 16 mmHg
AV Peak grad: 32.8 mmHg
Ao pk vel: 2.87 m/s
Area-P 1/2: 3.6 cm2
S' Lateral: 3 cm

## 2022-08-04 MED ORDER — PERFLUTREN LIPID MICROSPHERE
1.0000 mL | INTRAVENOUS | Status: AC | PRN
Start: 2022-08-04 — End: 2022-08-04
  Administered 2022-08-04: 2 mL via INTRAVENOUS

## 2022-08-18 IMAGING — CT CT ANGIO CHEST
2 series · 19 of 32 positions shown · IV contrast (APPLIED)
Comparison: None.

CLINICAL DATA: Bicuspid aortic valve

EXAM:
CT ANGIOGRAPHY CHEST WITH CONTRAST
TECHNIQUE: Multidetector CT imaging of the chest was performed using the
standard protocol during bolus administration of intravenous
contrast. Multiplanar CT image reconstructions and MIPs were
obtained to evaluate the vascular anatomy.
CONTRAST:  100mL OMNIPAQUE IOHEXOL 350 MG/ML SOLN

[Series 4: axial arterial · axial · arterial · 0.69mm/px · z∈[-772,-490]mm · 14 of 167 slices shown]
[im 13/167  lung]
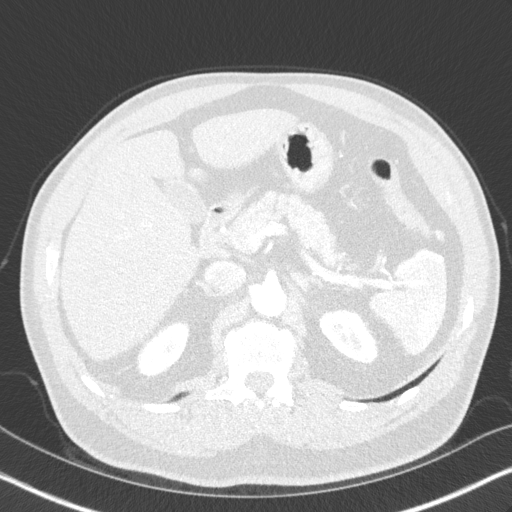
[im 26/167  mediastinal]
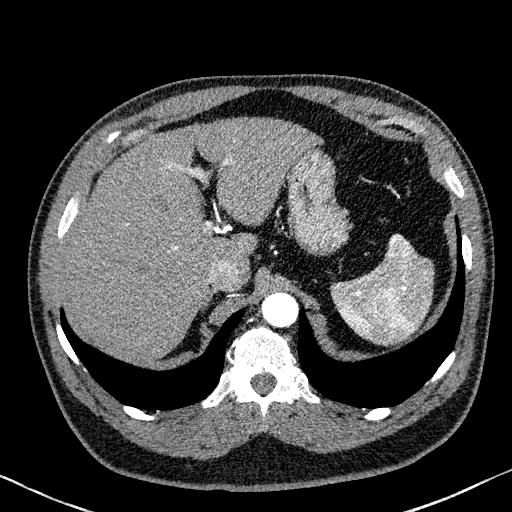
[im 39/167  lung]
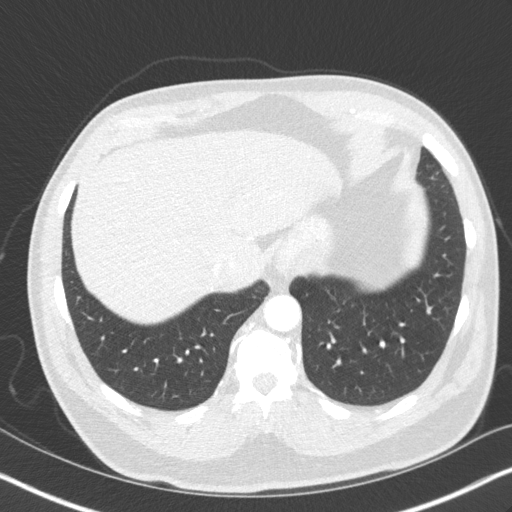
[im 52/167  mediastinal]
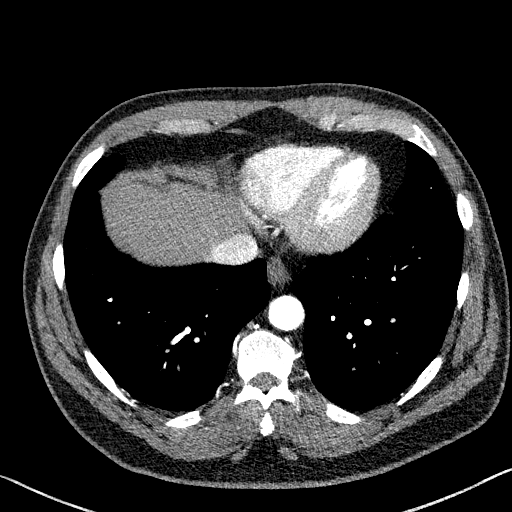
[im 64/167  lung]
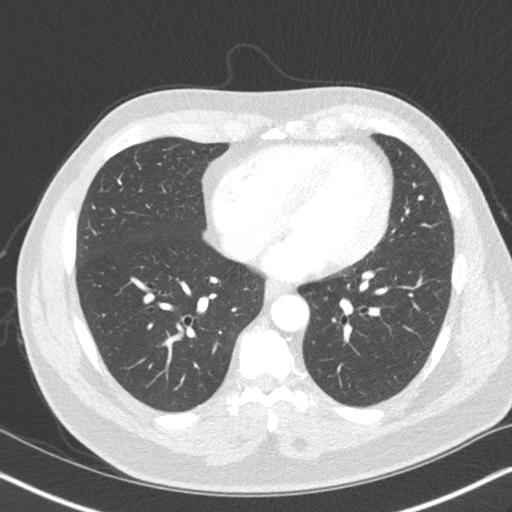
[im 77/167  mediastinal]
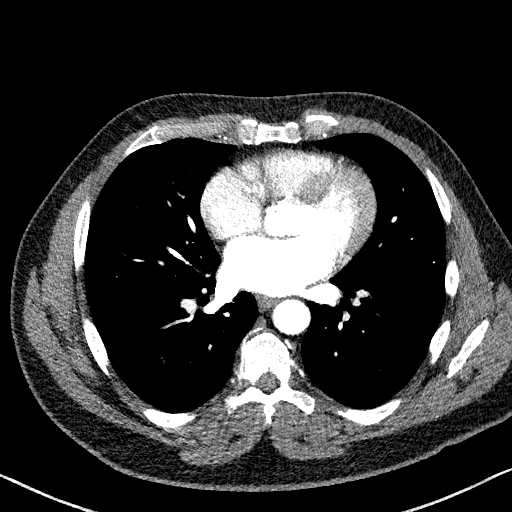
[im 79/167  lung]
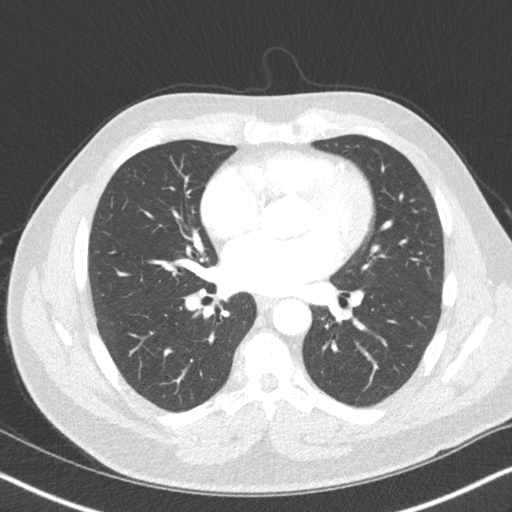
[im 84/167  mediastinal]
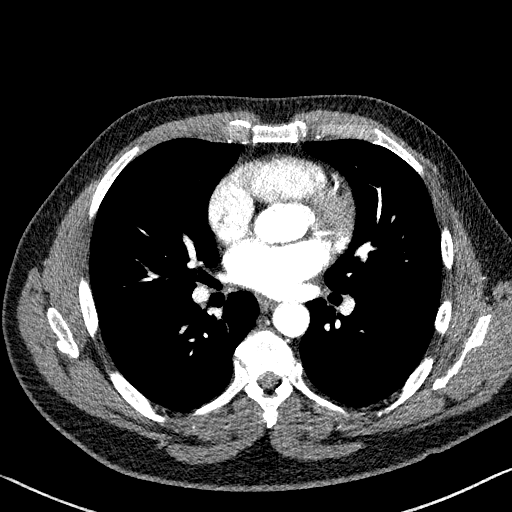
[im 90/167  lung]
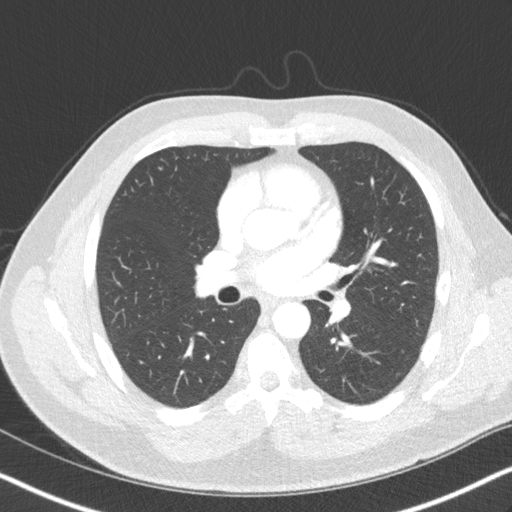
[im 103/167  mediastinal]
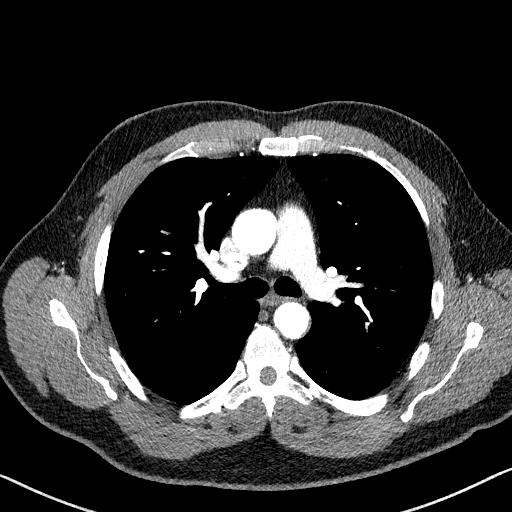
[im 115/167  lung]
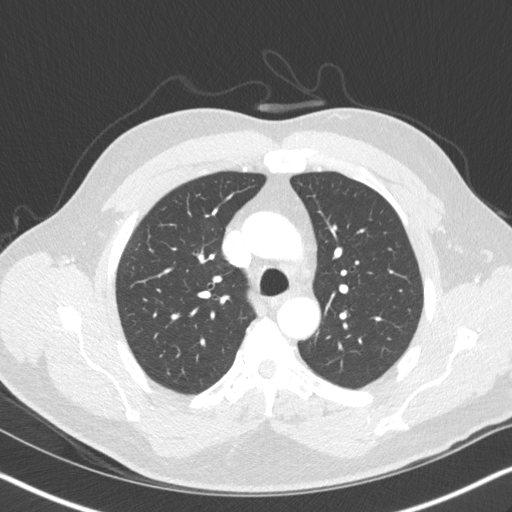
[im 128/167  mediastinal]
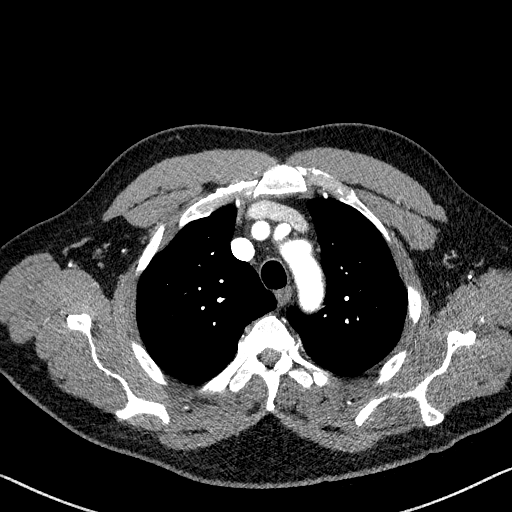
[im 141/167  lung]
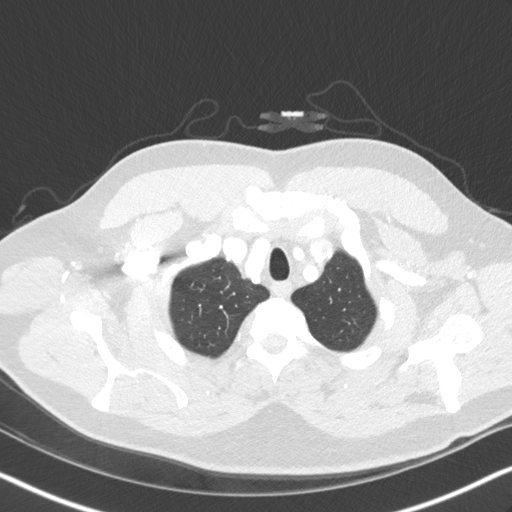
[im 154/167  mediastinal]
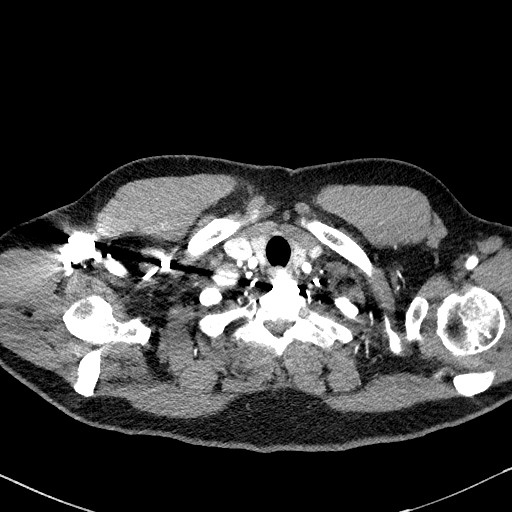

[Series 5: lung · axial · 0.69mm/px · z∈[-772,-630]mm · 5 of 167 slices shown]
[im 13/167  mediastinal]
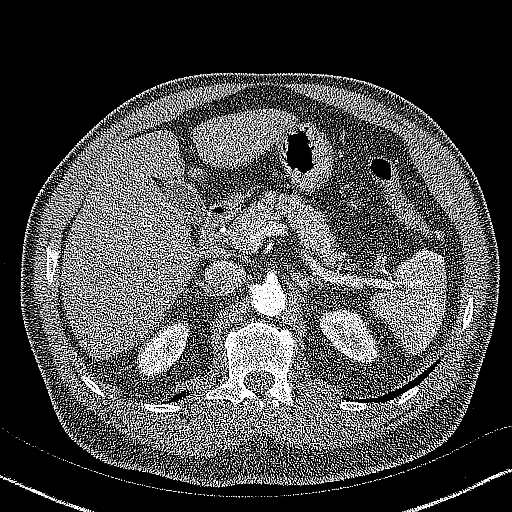
[im 39/167  mediastinal]
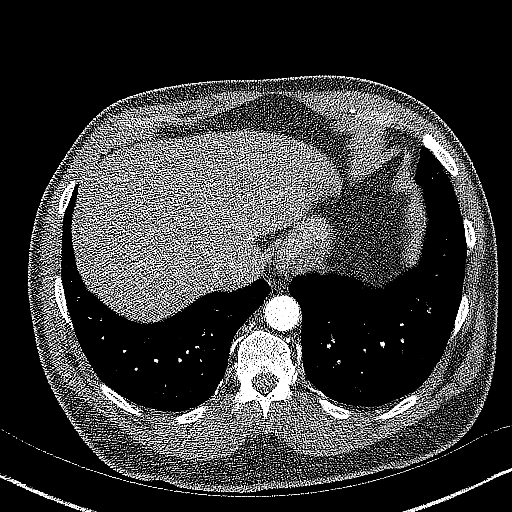
[im 64/167  mediastinal]
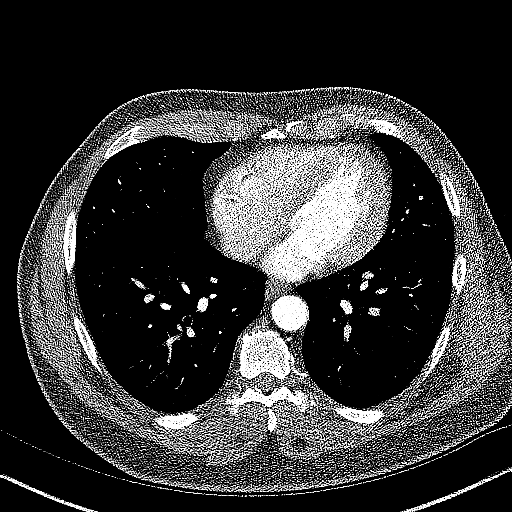
[im 79/167  mediastinal]
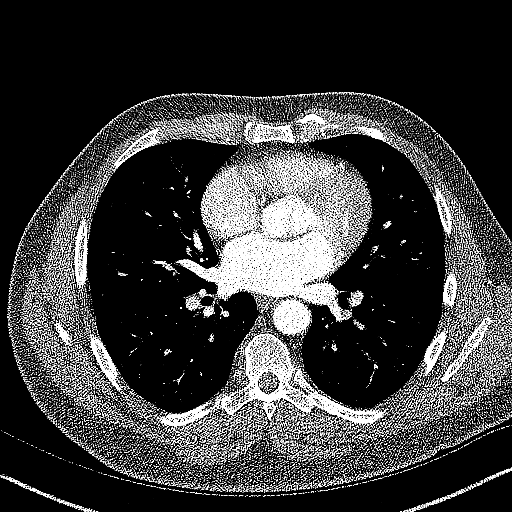
[im 84/167  mediastinal]
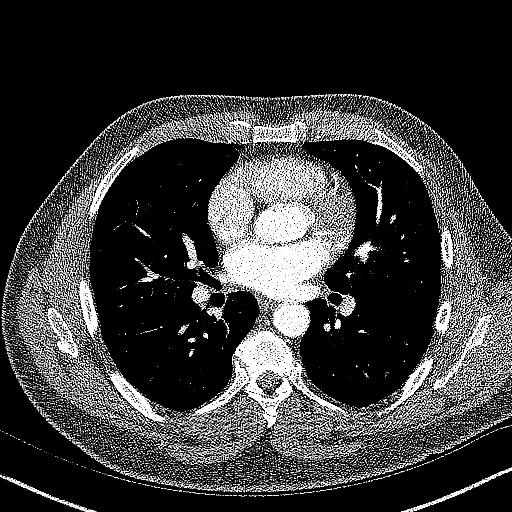

[19 of 32 positions shown; findings below may reference images not displayed]

FINDINGS: Cardiovascular: Conventional 3 vessel aortic arch anatomy. Thickened
and calcified aortic valve consistent with the clinical history of
bicuspid aortic valve. The aortic root, ascending aorta, transverse
and descending thoracic aorta all remain normal in caliber. No
evidence of dissection. The main pulmonary artery is normal in
caliber. The heart is normal in size. Calcifications present along
the coronary arteries. There appears to be a metallic stent in the
right coronary artery. Normal pulmonary venous drainage.

Mediastinum/Nodes: Unremarkable CT appearance of the thyroid gland.
No suspicious mediastinal or hilar adenopathy. No soft tissue
mediastinal mass. The thoracic esophagus is unremarkable.

Lungs/Pleura: The lungs are essentially clear. There is a tiny
punctate calcified granuloma in the periphery of the left lower lobe
(image 91 series 5) which is a benign finding and warrants no
further follow-up.

Upper Abdomen: 6 mm nonobstructing stone in the upper pole
collecting system of the left kidney.

Musculoskeletal: No acute fracture or aggressive appearing lytic or
blastic osseous lesion.

Review of the MIP images confirms the above findings.
IMPRESSION: 1. Thickened and calcified aortic valve consistent with the clinical
history of bicuspid aortic valve.
2. No evidence of aortic aneurysm or dissection.
3. Coronary artery calcifications with a coronary stent in the right
coronary artery.
4. Nonobstructing 6 mm stone in the upper pole collecting system of
the left kidney.

## 2022-12-24 ENCOUNTER — Ambulatory Visit: Payer: Commercial Managed Care - PPO | Admitting: Internal Medicine

## 2022-12-24 NOTE — Progress Notes (Deleted)
  Cardiology Office Note:  .   Date:  12/24/2022  ID:  DAMIN EBERLE, DOB May 15, 1960, MRN [id number] PCP: Dorothey Baseman, MD  [facility] Providers Cardiologist:  Yvonne Kendall, MD { Click to update primary MD,subspecialty MD or APP then REFRESH:1}    History of Present Illness: .   Carlos Lopez is a 62 y.o. male with a history of coronary artery disease status post PCI to the RCA (07/07/2018), bicuspid aortic valve with severe aortic stenosis status post bioprosthetic aortic valve replacement (09/13/2020), hypertension, hyperlipidemia, GERD, gout, and kidney stones, who presents for follow-up of CAD and valvular heart disease.  I met him in May after he transitioned his care from [facility] to our practice following Dr. Philemon Kingdom departure.  He noted mild shortness of breath when bending over but was otherwise asymptomatic.  Subsequent echo showed normal LVEF with mildly elevated bioprosthetic AVR gradient (16 mmHg, previously 10 mmHg).  ROS: See HPI  Studies Reviewed: Marland Kitchen        R/LHC (08/14/2020): LMCA normal.  LAD normal.  LCx with 80% mid vessel stenosis.  RCA with patent distal vessel stent and 40% stenosis at the proximal edge.  LVEF 65%.  Normal right heart pressures. LHC/PCI (07/07/2018): LMCA normal.  LAD normal.  LCx with 40% mid vessel stenosis.  Dominant RCA with up to 99% distal RCA disease.  Successful PCI to distal RCA with synergy 2.25 x 32 mm DES.   CV Surgery: Aortic valve replacement (09/13/2021, [facility]): 23 mm Inspiris Resilia bioprosthetic valve   EP Procedures and Devices: None   Non-Invasive Evaluation(s): TTE (08/04/2022): Normal LV size with mild LVH.  LVEF 60-65% with normal wall motion and diastolic parameters.  Normal RV size and function.  Normal biatrial size.  Mild MR and TR.  Bioprosthetic valve with mildly elevated gradient (mean gradient 16 mmHg, AVA 1.7 cm^2). Exercise MPI (12/13/2020): Low risk study without evidence of ischemia or  scar.  Patient exercised for 9 minutes. TTE (11/08/2020): Normal biventricular systolic function.  Well-seated aortic valve prosthesis with normal function.  Moderate mitral and tricuspid regurgitation.  Risk Assessment/Calculations:   {Does this patient have ATRIAL FIBRILLATION?:[phone]} No BP recorded.  {Refresh Note OR Click here to enter BP  :1}***       Physical Exam:   VS:  There were no vitals taken for this visit.   Wt Readings from Last 3 Encounters:  06/18/22 191 lb 12.8 oz (87 kg)  10/17/20 178 lb 1.6 oz (80.8 kg)  08/14/20 185 lb (83.9 kg)    General:  NAD. Neck: No JVD or HJR. Lungs: Clear to auscultation bilaterally without wheezes or crackles. Heart: Regular rate and rhythm without murmurs, rubs, or gallops. Abdomen: Soft, nontender, nondistended. Extremities: No lower extremity edema.  ASSESSMENT AND PLAN: .    ***    {Are you ordering a CV Procedure (e.g. stress test, cath, DCCV, TEE, etc)?   Press F2        :[id number]}  Dispo: ***  Signed, Yvonne Kendall, MD

## 2022-12-31 NOTE — Progress Notes (Deleted)
Cardiology Clinic Note   Date: 12/31/2022 ID: Carlos Lopez, DOB 11/18/1960, MRN [id number]  Primary Cardiologist:  Yvonne Kendall, MD  Patient Profile    Carlos Lopez is a 62 y.o. male who presents to the clinic today for ***    Past medical history significant for: CAD. R/LHC 07/07/2018 (AS): Distal RCA #1 50%, #2 99%.  Ostial RPDA 30%.  Proximal to mid LCx 40%.  Mild to moderate aortic valve stenosis.  Right heart pressure normal.  PCI with DES 2.25 x 32 mm to distal RCA. R/LHC 08/14/2020: Mid to distal RCA 40%.  Proximal to mid LCx 80%.  Normal LVEDP.  Recommend medical management.  Symptoms felt to be related to severe aortic stenosis. Aortic valve stenosis. AVR 10/07/2020 performed at [facility]. Echo 08/04/2022: EF 60 to 65%.  No RWMA.  Mild LVH.  Normal diastolic parameters.  Mild MR.  23 mm Inspiris bioprosthetic valve present in the aortic position, mild AS, mean gradient 16 mmHg. Hypertension. Hyperlipidemia. Lipid panel 07/21/2022: LDL 52, HDL 37, TG 280, total 145. GERD.  In summary, patient was first seen by Dr. Okey Dupre on 06/18/2022 to establish care at the request of Carlos Ditto, PA-C.  He has a history of CAD status post PCI as above in May 2020.  He underwent repeat R/LHC in July 2022 secondary to significant decrease in exercise tolerance with stress testing.  LHC demonstrated proximal to mid LCx 80% with recommendation to treat medically as symptoms were felt to be related to severe aortic stenosis.  He had bicuspid aortic valve with severe aortic stenosis s/p aortic valve replacement August 2022 performed at [facility].  He had previously been followed by Dr. Gwen Pounds.  He reported some dyspnea with bending over but not with exertion.  He was otherwise without complaint.  Echo was updated as detailed above.     History of Present Illness    Carlos Lopez is followed by Dr. Okey Dupre for the above outlined history.   Discussed the use  of AI scribe software for clinical note transcription with the patient, who gave verbal consent to proceed.         CAD S/p PCI with DES to distal RCA May 2020.  Repeat R/LHC secondary to decreased exercise tolerance during stress testing revealed proximal to mid LCx 80% with recommendation to treat medically secondary to severe AS.  Patient*** -Continue aspirin, atorvastatin.  Aortic valve stenosis S/p AVR August 2022 performed at [facility].  Echo June 2024 showed normal LV function with bioprosthetic valve in aortic position with mild AS, mean gradient 16 mmHg.  Patient*** -Continue***  Hypertension BP today*** -Continue Hyzaar.  Hyperlipidemia LDL June 2024 52, at goal. -Continue atorvastatin.   ROS: All other systems reviewed and are otherwise negative except as noted in History of Present Illness.  Studies Reviewed       ***  Risk Assessment/Calculations    {Does this patient have ATRIAL FIBRILLATION?:[phone]} No BP recorded.  {Refresh Note OR Click here to enter BP  :1}***        Physical Exam    VS:  There were no vitals taken for this visit. , BMI There is no height or weight on file to calculate BMI.  GEN: Well nourished, well developed, in no acute distress. Neck: No JVD or carotid bruits. Cardiac: *** RRR. No murmurs. No rubs or gallops.   Respiratory:  Respirations regular and unlabored. Clear to auscultation without rales, wheezing or rhonchi.  GI: Soft, nontender, nondistended. Extremities: Radials/DP/PT 2+ and equal bilaterally. No clubbing or cyanosis. No edema ***  Skin: Warm and dry, no rash. Neuro: Strength intact.  Assessment & Plan   Assessment and Plan               Disposition: ***     {Are you ordering a CV Procedure (e.g. stress test, cath, DCCV, TEE, etc)?   Press F2        :[id number]}   Signed, Etta Grandchild. Dellar Traber, DNP, NP-C

## 2023-01-01 ENCOUNTER — Ambulatory Visit: Payer: Commercial Managed Care - PPO | Attending: Student | Admitting: Student

## 2023-01-04 NOTE — Progress Notes (Unsigned)
Cardiology Clinic Note   Date: 01/06/2023 ID: MIKIEL BERGMAN, DOB 05-Apr-1960, MRN [id number]  Primary Cardiologist:  Yvonne Kendall, MD  Patient Profile    Carlos Lopez is a 62 y.o. male who presents to the clinic today for routine follow up.     Past medical history significant for: CAD. R/LHC 07/07/2018 (AS): Distal RCA #1 50%, #2 99%.  Ostial RPDA 30%.  Proximal to mid LCx 40%.  Mild to moderate aortic valve stenosis.  Right heart pressure normal.  PCI with DES 2.25 x 32 mm to distal RCA. R/LHC 08/14/2020: Mid to distal RCA 40%.  Proximal to mid LCx 80%.  Normal LVEDP.  Recommend medical management.  Symptoms felt to be related to severe aortic stenosis. Aortic valve stenosis. AVR 10/07/2020 performed at [facility]. Echo 08/04/2022: EF 60 to 65%.  No RWMA.  Mild LVH.  Normal diastolic parameters.  Mild MR.  23 mm Inspiris bioprosthetic valve present in the aortic position, mild AS, mean gradient 16 mmHg. Hypertension. Hyperlipidemia. Lipid panel 07/21/2022: LDL 52, HDL 37, TG 280, total 145. GERD.  In summary, patient was first seen by Dr. Okey Dupre on 06/18/2022 to establish care at the request of Carlos Ditto, PA-C.  He has a history of CAD status post PCI as above in May 2020.  He underwent repeat R/LHC in July 2022 secondary to significant decrease in exercise tolerance with stress testing.  LHC demonstrated proximal to mid LCx 80% with recommendation to treat medically as symptoms were felt to be related to severe aortic stenosis.  He had bicuspid aortic valve with severe aortic stenosis s/p aortic valve replacement August 2022 performed at [facility].  He had previously been followed by Dr. Gwen Pounds.  He reported some dyspnea with bending over but not with exertion.  He was otherwise without complaint.  Echo was updated as detailed above.     History of Present Illness    Carlos Lopez is followed by Dr. Okey Dupre for the above outlined history.    Today, patient presents alone for routine follow up. He is doing well with no complaints. Patient denies shortness of breath, dyspnea on exertion, lower extremity edema, orthopnea or PND. No chest pain, pressure, or tightness. No palpitations.  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      ROS: All other systems reviewed and are otherwise negative except as noted in History of Present Illness.  Studies Reviewed    EKG Interpretation Date/Time:  Wednesday January 06 2023 15:39:14 EST Ventricular Rate:  71 PR Interval:  134 QRS Duration:  74 QT Interval:  410 QTC Calculation: 445 R Axis:   25  Text Interpretation: Normal sinus rhythm T wave abnormality, consider lateral ischemia When compared with ECG of 08-Jul-2018 04:57, Non-specific change in ST segment in Inferior leads T wave inversion no longer evident in Inferior leads T wave inversion less evident in Lateral leads Confirmed by Carlos Levering [phone]) on 01/06/2023 3:51:28 PM       Physical Exam    VS:  BP 123/82 (BP Location: Left Arm, Patient Position: Sitting, Cuff Size: Normal)   Pulse 71   Ht 5\' 10"  (1.778 m)   Wt 185 lb 12.8 oz (84.3 kg)   SpO2 98%   BMI 26.66 kg/m  , BMI Body mass index is 26.66 kg/m.  GEN: Well nourished, well developed, in no acute distress. Neck: No JVD or carotid bruits. Cardiac:  RRR. No murmurs. No rubs or gallops.  Respiratory:  Respirations regular and unlabored. Clear to auscultation without rales, wheezing or rhonchi. GI: Soft, nontender, nondistended. Extremities: Radials/DP/PT 2+ and equal bilaterally. No clubbing or cyanosis. No edema.  Skin: Warm and dry, no rash. Neuro: Strength intact.  Assessment & Plan   CAD S/p PCI with DES to distal RCA May 2020.  Repeat R/LHC secondary to decreased exercise tolerance during stress testing revealed proximal to mid LCx 80% with recommendation to treat medically secondary to severe AS.  Patient denies chest pain, pressure  or tightness.  -Continue aspirin, atorvastatin.  Aortic valve stenosis S/p AVR August 2022 performed at [facility].  Echo June 2024 showed normal LV function with bioprosthetic valve in aortic position with mild AS, mean gradient 16 mmHg.  Patient denies lightheadedness, presyncope or syncope. No lower extremity edema, orthopnea or PND. 2/6 systolic murmur on exam today.   Hypertension BP today 123/82. -Continue Hyzaar.  Hyperlipidemia LDL June 2024 52, at goal. -Continue atorvastatin.     Disposition: Return in 6 months or sooner as needed.          Signed, Etta Grandchild. Rozalynn Buege, DNP, NP-C

## 2023-01-06 ENCOUNTER — Encounter: Payer: Self-pay | Admitting: Student

## 2023-01-06 ENCOUNTER — Ambulatory Visit: Payer: Commercial Managed Care - PPO | Attending: Student | Admitting: Student

## 2023-01-06 VITALS — BP 123/82 | HR 71 | Ht 70.0 in | Wt 185.8 lb

## 2023-01-06 DIAGNOSIS — I10 Essential (primary) hypertension: Secondary | ICD-10-CM | POA: Diagnosis not present

## 2023-01-06 DIAGNOSIS — I2510 Atherosclerotic heart disease of native coronary artery without angina pectoris: Secondary | ICD-10-CM

## 2023-01-06 DIAGNOSIS — Z8679 Personal history of other diseases of the circulatory system: Secondary | ICD-10-CM | POA: Diagnosis not present

## 2023-01-06 DIAGNOSIS — E785 Hyperlipidemia, unspecified: Secondary | ICD-10-CM | POA: Diagnosis not present

## 2023-01-06 DIAGNOSIS — Z952 Presence of prosthetic heart valve: Secondary | ICD-10-CM | POA: Diagnosis not present

## 2023-01-06 NOTE — Patient Instructions (Signed)
Medication Instructions:  - No changes *If you need a refill on your cardiac medications before your next appointment, please call your pharmacy*  Lab Work: - None ordered  Testing/Procedures: - None ordered  Follow-Up: At [facility], you and your health needs are our priority.  As part of our continuing mission to provide you with exceptional heart care, we have created designated Provider Care Teams.  These Care Teams include your primary Cardiologist (physician) and Advanced Practice Providers (APPs -  Physician Assistants and Nurse Practitioners) who all work together to provide you with the care you need, when you need it.  Your next appointment:   6 month(s)  Provider:   Carlos Levering, NP

## 2023-01-13 ENCOUNTER — Other Ambulatory Visit
Admission: RE | Admit: 2023-01-13 | Discharge: 2023-01-13 | Disposition: A | Discharge status: Home / Self Care | Payer: Commercial Managed Care - PPO | Source: Ambulatory Visit | Attending: Orthopedic Surgery | Admitting: Orthopedic Surgery

## 2023-01-13 DIAGNOSIS — M25461 Effusion, right knee: Secondary | ICD-10-CM | POA: Diagnosis present

## 2023-01-13 LAB — SYNOVIAL CELL COUNT + DIFF, W/ CRYSTALS
Crystals, Fluid: NONE SEEN
Eosinophils-Synovial: 0 %
Lymphocytes-Synovial Fld: 38 %
Monocyte-Macrophage-Synovial Fluid: 60 %
Neutrophil, Synovial: 2 %
WBC, Synovial: 241 /mm3 — ABNORMAL HIGH (ref 0–200)

## 2023-04-15 ENCOUNTER — Other Ambulatory Visit: Payer: Self-pay | Admitting: Sports Medicine

## 2023-04-15 DIAGNOSIS — M51362 Other intervertebral disc degeneration, lumbar region with discogenic back pain and lower extremity pain: Secondary | ICD-10-CM

## 2023-04-15 DIAGNOSIS — M5416 Radiculopathy, lumbar region: Secondary | ICD-10-CM

## 2023-04-15 DIAGNOSIS — G8929 Other chronic pain: Secondary | ICD-10-CM

## 2023-04-15 DIAGNOSIS — M47816 Spondylosis without myelopathy or radiculopathy, lumbar region: Secondary | ICD-10-CM

## 2023-04-20 ENCOUNTER — Ambulatory Visit
Admission: RE | Admit: 2023-04-20 | Discharge: 2023-04-20 | Disposition: A | Discharge status: Home / Self Care | Source: Ambulatory Visit | Attending: Sports Medicine | Admitting: Sports Medicine

## 2023-04-20 DIAGNOSIS — M51362 Other intervertebral disc degeneration, lumbar region with discogenic back pain and lower extremity pain: Secondary | ICD-10-CM | POA: Diagnosis present

## 2023-04-20 DIAGNOSIS — M5416 Radiculopathy, lumbar region: Secondary | ICD-10-CM | POA: Diagnosis present

## 2023-04-20 DIAGNOSIS — M5442 Lumbago with sciatica, left side: Secondary | ICD-10-CM | POA: Diagnosis present

## 2023-04-20 DIAGNOSIS — M5441 Lumbago with sciatica, right side: Secondary | ICD-10-CM | POA: Insufficient documentation

## 2023-04-20 DIAGNOSIS — M47816 Spondylosis without myelopathy or radiculopathy, lumbar region: Secondary | ICD-10-CM | POA: Diagnosis present

## 2023-04-20 DIAGNOSIS — G8929 Other chronic pain: Secondary | ICD-10-CM | POA: Insufficient documentation

## 2023-04-30 ENCOUNTER — Other Ambulatory Visit: Payer: Self-pay | Admitting: Sports Medicine

## 2023-04-30 DIAGNOSIS — M4807 Spinal stenosis, lumbosacral region: Secondary | ICD-10-CM

## 2023-04-30 DIAGNOSIS — M5416 Radiculopathy, lumbar region: Secondary | ICD-10-CM

## 2023-04-30 DIAGNOSIS — G039 Meningitis, unspecified: Secondary | ICD-10-CM

## 2023-05-05 ENCOUNTER — Ambulatory Visit
Admission: RE | Admit: 2023-05-05 | Discharge: 2023-05-05 | Disposition: A | Discharge status: Home / Self Care | Source: Ambulatory Visit | Attending: Sports Medicine | Admitting: Sports Medicine

## 2023-05-05 DIAGNOSIS — M5416 Radiculopathy, lumbar region: Secondary | ICD-10-CM | POA: Insufficient documentation

## 2023-05-05 DIAGNOSIS — M4807 Spinal stenosis, lumbosacral region: Secondary | ICD-10-CM | POA: Insufficient documentation

## 2023-05-05 DIAGNOSIS — G039 Meningitis, unspecified: Secondary | ICD-10-CM | POA: Insufficient documentation

## 2023-05-05 MED ORDER — GADOBUTROL 1 MMOL/ML IV SOLN
8.0000 mL | Freq: Once | INTRAVENOUS | Status: AC | PRN
  Administered 2023-05-05: 8 mL via INTRAVENOUS

## 2023-05-13 ENCOUNTER — Other Ambulatory Visit: Payer: Self-pay | Admitting: Gastroenterology

## 2023-05-13 DIAGNOSIS — R17 Unspecified jaundice: Secondary | ICD-10-CM

## 2023-05-21 ENCOUNTER — Ambulatory Visit
Admission: RE | Admit: 2023-05-21 | Discharge: 2023-05-21 | Disposition: A | Discharge status: Home / Self Care | Source: Ambulatory Visit | Attending: Gastroenterology | Admitting: Gastroenterology

## 2023-05-21 DIAGNOSIS — R17 Unspecified jaundice: Secondary | ICD-10-CM | POA: Insufficient documentation

## 2023-06-25 NOTE — H&P (View-Only) (Signed)
 Referring Physician:  Saddie Crane, DO [address] [place],  [place] [address]  Primary Physician:  Efraim Grange, NP  History of Present Illness: 06/28/2023 Carlos Lopez is here today with a chief complaint of right lower extremity radiculopathy.  He has pain in his right side that goes into his buttocks and down his right leg.  This has been bothering him for over 3 months, he has been treating this conservatively with physical therapy and injections.  Continues to have worsening lower extremity back pain.  There is concern for L4 or L5 radiculopathy and he had an MRI which demonstrated multilevel lumbar spondylosis.  He is referred to neurosurgery for evaluation after failing conservative management.  States that his pain in the leg is often a 9 out of 10 causing severe impact on his quality of life and ability to perform his ADLs.  He does feel better if he sits forward and bends his low back.  Bowel/Bladder Dysfunction: none  Conservative measures:  Physical therapy: has participated in at [facility], making the pain worse.  Multimodal medical therapy including regular antiinflammatories: tramadol, gabapentin, bayer back and body Injections:  06/08/2023: Right L4-5 transforaminal ESI (2 days) 05/17/2023: Right L4-5 transforaminal ESI (100% relief x3 days)  He has had a prior cervical intervention but no lumbar interventions.  The symptoms are causing a significant impact on the patient's life.   I have utilized the care everywhere function in epic to review the outside records available from external health systems.  Review of Systems:  A 10 point review of systems is negative, except for the pertinent positives and negatives detailed in the HPI.  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 Jodeane Mulligan   CORONARY STENT INTERVENTION N/A 07/07/2018   Procedure: CORONARY STENT INTERVENTION;  Surgeon: Percival Brace, MD;  Location: [facility] INVASIVE CV LAB;  Service: Cardiovascular;  Laterality: N/A;  RCA   NECK SURGERY Right    RIGHT/LEFT HEART CATH AND CORONARY ANGIOGRAPHY N/A 07/07/2018   Procedure: RIGHT/LEFT HEART CATH AND CORONARY ANGIOGRAPHY;  Surgeon: Michelle Aid, MD;  Location: [facility] INVASIVE CV LAB;  Service: Cardiovascular;  Laterality: N/A;   RIGHT/LEFT HEART CATH AND CORONARY ANGIOGRAPHY N/A 08/14/2020   Procedure: RIGHT/LEFT HEART CATH AND CORONARY ANGIOGRAPHY;  Surgeon: Michelle Aid, MD;  Location: [facility] INVASIVE CV LAB;  Service: Cardiovascular;  Laterality: N/A;   SHOULDER SURGERY Right     Allergies: Allergies as of 06/28/2023   (No Known Allergies)    Medications:  Current Outpatient Medications:    Cholecalciferol 50 MCG (2000 UT) CAPS, Take 2,000 Units by mouth daily., Disp: , Rfl:    metFORMIN (GLUCOPHAGE-XR) 500 MG 24 hr tablet, Take 1,500 mg by mouth., Disp: , Rfl:    allopurinol (ZYLOPRIM) 300 MG tablet, Take 300 mg by mouth daily., Disp: , Rfl:    aspirin  81 MG chewable tablet, Chew 1 tablet (81 mg total) by mouth daily., Disp: 90 tablet, Rfl: 4  atorvastatin  (LIPITOR) 80 MG tablet, Take 80 mg by mouth daily., Disp: , Rfl:    Cyanocobalamin (B-12 PO), Take 1 tablet by mouth daily., Disp: , Rfl:    indomethacin (INDOCIN) 50 MG capsule, Take 50 mg by mouth 3 (three) times daily as needed., Disp: , Rfl:    pantoprazole (PROTONIX) 40 MG tablet, Take 40 mg by mouth daily., Disp: , Rfl:    valsartan (DIOVAN) 160 MG tablet, Take 160 mg by mouth daily., Disp: , Rfl:   Social  History: Social History   Tobacco Use   Smoking status: Never   Smokeless tobacco: Former  Building services engineer status: Never Used  Substance Use Topics   Alcohol use: Never   Drug use: Never    Family Medical History: Family History  Problem Relation Age of Onset   Liver cancer Mother    Vascular Disease Father    Heart disease Neg Hx     Physical Examination: Vitals:   06/28/23 1311  BP: 130/84    General: Patient is in no apparent distress. Attention to examination is appropriate.  Neck:   Supple.  Full range of motion.  Respiratory: Patient is breathing without any difficulty.   NEUROLOGICAL:     Awake, alert, oriented to person, place, and time.  Speech is clear and fluent.   Cranial Nerves: Pupils equal round and reactive to light.  Facial tone is symmetric.  Facial sensation is symmetric. Shoulder shrug is symmetric. Tongue protrusion is midline.    Strength:  Side Iliopsoas Quads Hamstring PF DF EHL  R 5 5 5 5 5 5   L 5 5 5 5 5 5    Reflexes are 2+ and symmetric at the biceps, triceps, brachioradialis, patella and achilles.,  He does have a slightly decreased right sided medial hamstrings reflex compared to the left   Hoffman's is absent. Clonus is absent  Bilateral upper and lower extremity sensation is intact to light touch     No evidence of dysmetria noted.    Imaging: Narrative & Impression  CLINICAL DATA:  Back pain   EXAM: MRI LUMBAR SPINE WITHOUT AND WITH CONTRAST   TECHNIQUE: Multiplanar and multiecho pulse sequences of the lumbar spine were obtained without and with intravenous contrast.   CONTRAST:  8mL GADAVIST  GADOBUTROL  1 MMOL/ML IV SOLN   COMPARISON:  04/20/2023   FINDINGS: Segmentation: The lowest lumbar type non-rib-bearing vertebra is labeled as L5.   Alignment: 3 mm degenerative retrolisthesis at L1-2 and L2-3, with 3 mm degenerative anterolisthesis at L5-S1.   Vertebrae: Type 2 degenerative endplate findings  throughout the lumbar spine but especially at L1-2, L4-5, and L5-S1. Disc desiccation and loss of disc height at all levels with multilevel Schmorl's nodes. No fracture or acute bony findings.   Conus medullaris and cauda equina: Conus extends to the upper L2 level. Conus and cauda equina appear normal.   Paraspinal and other soft tissues: Unremarkable   Disc levels:   T12-L1: Mild displacement of the right T12 nerve in the lateral extraforaminal space due to right lateral extraforaminal disc protrusion. Disc bulge noted. Borderline central narrowing of the thecal sac. Appearance similar to prior.   L1-2: Moderate central narrowing of the thecal sac with mild left foraminal stenosis and displacement the left L1 nerve in the lateral extraforaminal space along with borderline left subarticular lateral recess stenosis due to disc osteophyte complex and facet arthropathy. The central narrowing of the thecal sac is minimally worsened.   L2-3: Moderate  right and mild left displacement of the L2 nerves in the lateral extraforaminal space along with mild central narrowing of the thecal sac due to disc osteophyte complex and right lateral extraforaminal disc protrusion along with degenerative facet arthropathy. Similar appearance to previous.   L3-4: Severe central narrowing of the thecal sac especially eccentric to the right with moderate bilateral foraminal stenosis, mild displacement of the bilateral L3 nerves in the lateral extraforaminal space, and prominent right subarticular lateral recess stenosis due to right lateral recess disc protrusion, disc osteophyte complex, and facet arthropathy. Appearance similar to prior.   L4-5: Moderate central narrowing of the thecal sac with mild bilateral foraminal stenosis and mild bilateral subarticular lateral recess stenosis due to disc osteophyte complex and facet arthropathy. Similar to prior.   L5-S1: Mild to moderate left and mild  right foraminal stenosis with mild left subarticular lateral recess stenosis due to disc osteophyte complex, left inferior foraminal disc protrusion, and facet arthropathy. Appearance similar to prior.   IMPRESSION: 1. Lumbar spondylosis and degenerative disc disease, causing prominent impingement at L3-4; moderate impingement at L1-2, L2-3, and L4-5; mild to moderate impingement at L5-S1; and mild impingement at T12-L1, as noted above. The degree of impingement is mostly stable although the central narrowing of the thecal sac at L1-2 is mildly worsened.     Electronically Signed   By: Freida Jes M.D.   On: 05/05/2023 18:31   I have reviewed his MRI as well as his x-ray from clinic today.  His MRI demonstrates a large disc herniation that is right-sided and paracentral at L3-L4 with significant amount of right-sided stenosis, at L4-5 he has a significant amount of impingement secondary to broad-based disc bulge and facet arthropathy resulting in moderate compression at this level.  His x-rays were reviewed with no evidence of pathological movement at the L3-L5 levels.  Awaiting final read on his x-rays  Medical Decision Making/Assessment and Plan: Carlos Lopez is a pleasant 63 y.o. male with history of a lumbar radiculopathy who has been treated conservatively over the past 3 to 4 months.  He has had physical therapy and injections.  His injections give him 2 to 3 days of improvement but then he continues to have symptoms.  He has done well with prednisone tapers, however unfortunately once he is off the medication his symptoms recur immediately.  They are always in the same location rating down the leg into an L4-L5 dermatome, thankfully he does not have any weakness.  He does have some slight decrease in his reflex at the medial hamstrings on the right.  His MRI demonstrates a large L3-4 disc herniation on the right as well as significant lateral recess stenosis on the right side at  L4-5, given these 2 areas of compression as well as his symptoms that correlate clearly with L4 and L5 radiculopathies we will plan for a L3-4 discectomy and a L4-5 hemilaminectomy and lateral recess decompression to help with his right-sided symptoms.  He does not need a fusion at this time although he does have a significant amount of spondylosis, he does not have any pathologic motion and he had mild back soreness prior to that herniation event.  We discussed the risks of the procedure including instability, spinal fluid leak, nerve injury, failure to improve his symptoms among others as well as the risk of anesthesia.  He would like to go forward with the procedure.  Thank you for involving me in the care of this patient.  Carroll Clamp MD/MSCR Neurosurgery

## 2023-06-25 NOTE — Progress Notes (Signed)
 Referring Physician:  Saddie Crane, DO [address] [place],  [place] [address]  Primary Physician:  Efraim Grange, NP  History of Present Illness: 06/28/2023 Carlos Lopez is here today with a chief complaint of right lower extremity radiculopathy.  He has pain in his right side that goes into his buttocks and down his right leg.  This has been bothering him for over 3 months, he has been treating this conservatively with physical therapy and injections.  Continues to have worsening lower extremity back pain.  There is concern for L4 or L5 radiculopathy and he had an MRI which demonstrated multilevel lumbar spondylosis.  He is referred to neurosurgery for evaluation after failing conservative management.  States that his pain in the leg is often a 9 out of 10 causing severe impact on his quality of life and ability to perform his ADLs.  He does feel better if he sits forward and bends his low back.  Bowel/Bladder Dysfunction: none  Conservative measures:  Physical therapy: has participated in at [facility], making the pain worse.  Multimodal medical therapy including regular antiinflammatories: tramadol, gabapentin, bayer back and body Injections:  06/08/2023: Right L4-5 transforaminal ESI (2 days) 05/17/2023: Right L4-5 transforaminal ESI (100% relief x3 days)  He has had a prior cervical intervention but no lumbar interventions.  The symptoms are causing a significant impact on the patient's life.   I have utilized the care everywhere function in epic to review the outside records available from external health systems.  Review of Systems:  A 10 point review of systems is negative, except for the pertinent positives and negatives detailed in the HPI.  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 Jodeane Mulligan   CORONARY STENT INTERVENTION N/A 07/07/2018   Procedure: CORONARY STENT INTERVENTION;  Surgeon: Percival Brace, MD;  Location: [facility] INVASIVE CV LAB;  Service: Cardiovascular;  Laterality: N/A;  RCA   NECK SURGERY Right    RIGHT/LEFT HEART CATH AND CORONARY ANGIOGRAPHY N/A 07/07/2018   Procedure: RIGHT/LEFT HEART CATH AND CORONARY ANGIOGRAPHY;  Surgeon: Michelle Aid, MD;  Location: [facility] INVASIVE CV LAB;  Service: Cardiovascular;  Laterality: N/A;   RIGHT/LEFT HEART CATH AND CORONARY ANGIOGRAPHY N/A 08/14/2020   Procedure: RIGHT/LEFT HEART CATH AND CORONARY ANGIOGRAPHY;  Surgeon: Michelle Aid, MD;  Location: [facility] INVASIVE CV LAB;  Service: Cardiovascular;  Laterality: N/A;   SHOULDER SURGERY Right     Allergies: Allergies as of 06/28/2023   (No Known Allergies)    Medications:  Current Outpatient Medications:    Cholecalciferol 50 MCG (2000 UT) CAPS, Take 2,000 Units by mouth daily., Disp: , Rfl:    metFORMIN (GLUCOPHAGE-XR) 500 MG 24 hr tablet, Take 1,500 mg by mouth., Disp: , Rfl:    allopurinol (ZYLOPRIM) 300 MG tablet, Take 300 mg by mouth daily., Disp: , Rfl:    aspirin  81 MG chewable tablet, Chew 1 tablet (81 mg total) by mouth daily., Disp: 90 tablet, Rfl: 4  atorvastatin  (LIPITOR) 80 MG tablet, Take 80 mg by mouth daily., Disp: , Rfl:    Cyanocobalamin (B-12 PO), Take 1 tablet by mouth daily., Disp: , Rfl:    indomethacin (INDOCIN) 50 MG capsule, Take 50 mg by mouth 3 (three) times daily as needed., Disp: , Rfl:    pantoprazole (PROTONIX) 40 MG tablet, Take 40 mg by mouth daily., Disp: , Rfl:    valsartan (DIOVAN) 160 MG tablet, Take 160 mg by mouth daily., Disp: , Rfl:   Social  History: Social History   Tobacco Use   Smoking status: Never   Smokeless tobacco: Former  Building services engineer status: Never Used  Substance Use Topics   Alcohol use: Never   Drug use: Never    Family Medical History: Family History  Problem Relation Age of Onset   Liver cancer Mother    Vascular Disease Father    Heart disease Neg Hx     Physical Examination: Vitals:   06/28/23 1311  BP: 130/84    General: Patient is in no apparent distress. Attention to examination is appropriate.  Neck:   Supple.  Full range of motion.  Respiratory: Patient is breathing without any difficulty.   NEUROLOGICAL:     Awake, alert, oriented to person, place, and time.  Speech is clear and fluent.   Cranial Nerves: Pupils equal round and reactive to light.  Facial tone is symmetric.  Facial sensation is symmetric. Shoulder shrug is symmetric. Tongue protrusion is midline.    Strength:  Side Iliopsoas Quads Hamstring PF DF EHL  R 5 5 5 5 5 5   L 5 5 5 5 5 5    Reflexes are 2+ and symmetric at the biceps, triceps, brachioradialis, patella and achilles.,  He does have a slightly decreased right sided medial hamstrings reflex compared to the left   Hoffman's is absent. Clonus is absent  Bilateral upper and lower extremity sensation is intact to light touch     No evidence of dysmetria noted.    Imaging: Narrative & Impression  CLINICAL DATA:  Back pain   EXAM: MRI LUMBAR SPINE WITHOUT AND WITH CONTRAST   TECHNIQUE: Multiplanar and multiecho pulse sequences of the lumbar spine were obtained without and with intravenous contrast.   CONTRAST:  8mL GADAVIST  GADOBUTROL  1 MMOL/ML IV SOLN   COMPARISON:  04/20/2023   FINDINGS: Segmentation: The lowest lumbar type non-rib-bearing vertebra is labeled as L5.   Alignment: 3 mm degenerative retrolisthesis at L1-2 and L2-3, with 3 mm degenerative anterolisthesis at L5-S1.   Vertebrae: Type 2 degenerative endplate findings  throughout the lumbar spine but especially at L1-2, L4-5, and L5-S1. Disc desiccation and loss of disc height at all levels with multilevel Schmorl's nodes. No fracture or acute bony findings.   Conus medullaris and cauda equina: Conus extends to the upper L2 level. Conus and cauda equina appear normal.   Paraspinal and other soft tissues: Unremarkable   Disc levels:   T12-L1: Mild displacement of the right T12 nerve in the lateral extraforaminal space due to right lateral extraforaminal disc protrusion. Disc bulge noted. Borderline central narrowing of the thecal sac. Appearance similar to prior.   L1-2: Moderate central narrowing of the thecal sac with mild left foraminal stenosis and displacement the left L1 nerve in the lateral extraforaminal space along with borderline left subarticular lateral recess stenosis due to disc osteophyte complex and facet arthropathy. The central narrowing of the thecal sac is minimally worsened.   L2-3: Moderate  right and mild left displacement of the L2 nerves in the lateral extraforaminal space along with mild central narrowing of the thecal sac due to disc osteophyte complex and right lateral extraforaminal disc protrusion along with degenerative facet arthropathy. Similar appearance to previous.   L3-4: Severe central narrowing of the thecal sac especially eccentric to the right with moderate bilateral foraminal stenosis, mild displacement of the bilateral L3 nerves in the lateral extraforaminal space, and prominent right subarticular lateral recess stenosis due to right lateral recess disc protrusion, disc osteophyte complex, and facet arthropathy. Appearance similar to prior.   L4-5: Moderate central narrowing of the thecal sac with mild bilateral foraminal stenosis and mild bilateral subarticular lateral recess stenosis due to disc osteophyte complex and facet arthropathy. Similar to prior.   L5-S1: Mild to moderate left and mild  right foraminal stenosis with mild left subarticular lateral recess stenosis due to disc osteophyte complex, left inferior foraminal disc protrusion, and facet arthropathy. Appearance similar to prior.   IMPRESSION: 1. Lumbar spondylosis and degenerative disc disease, causing prominent impingement at L3-4; moderate impingement at L1-2, L2-3, and L4-5; mild to moderate impingement at L5-S1; and mild impingement at T12-L1, as noted above. The degree of impingement is mostly stable although the central narrowing of the thecal sac at L1-2 is mildly worsened.     Electronically Signed   By: Freida Jes M.D.   On: 05/05/2023 18:31   I have reviewed his MRI as well as his x-ray from clinic today.  His MRI demonstrates a large disc herniation that is right-sided and paracentral at L3-L4 with significant amount of right-sided stenosis, at L4-5 he has a significant amount of impingement secondary to broad-based disc bulge and facet arthropathy resulting in moderate compression at this level.  His x-rays were reviewed with no evidence of pathological movement at the L3-L5 levels.  Awaiting final read on his x-rays  Medical Decision Making/Assessment and Plan: Carlos Lopez is a pleasant 63 y.o. male with history of a lumbar radiculopathy who has been treated conservatively over the past 3 to 4 months.  He has had physical therapy and injections.  His injections give him 2 to 3 days of improvement but then he continues to have symptoms.  He has done well with prednisone tapers, however unfortunately once he is off the medication his symptoms recur immediately.  They are always in the same location rating down the leg into an L4-L5 dermatome, thankfully he does not have any weakness.  He does have some slight decrease in his reflex at the medial hamstrings on the right.  His MRI demonstrates a large L3-4 disc herniation on the right as well as significant lateral recess stenosis on the right side at  L4-5, given these 2 areas of compression as well as his symptoms that correlate clearly with L4 and L5 radiculopathies we will plan for a L3-4 discectomy and a L4-5 hemilaminectomy and lateral recess decompression to help with his right-sided symptoms.  He does not need a fusion at this time although he does have a significant amount of spondylosis, he does not have any pathologic motion and he had mild back soreness prior to that herniation event.  We discussed the risks of the procedure including instability, spinal fluid leak, nerve injury, failure to improve his symptoms among others as well as the risk of anesthesia.  He would like to go forward with the procedure.  Thank you for involving me in the care of this patient.  Carroll Clamp MD/MSCR Neurosurgery

## 2023-06-28 ENCOUNTER — Encounter: Payer: Self-pay | Admitting: Neurosurgery

## 2023-06-28 ENCOUNTER — Ambulatory Visit
Admission: RE | Admit: 2023-06-28 | Discharge: 2023-06-28 | Disposition: A | Discharge status: Home / Self Care | Attending: Neurosurgery | Admitting: Neurosurgery

## 2023-06-28 ENCOUNTER — Other Ambulatory Visit: Payer: Self-pay

## 2023-06-28 ENCOUNTER — Ambulatory Visit: Admitting: Neurosurgery

## 2023-06-28 ENCOUNTER — Ambulatory Visit
Admission: RE | Admit: 2023-06-28 | Discharge: 2023-06-28 | Disposition: A | Discharge status: Home / Self Care | Source: Ambulatory Visit | Attending: Neurosurgery | Admitting: Neurosurgery

## 2023-06-28 ENCOUNTER — Telehealth: Payer: Self-pay | Admitting: Neurosurgery

## 2023-06-28 ENCOUNTER — Telehealth: Payer: Self-pay | Admitting: *Deleted

## 2023-06-28 VITALS — BP 130/84 | Ht 70.0 in | Wt 185.0 lb

## 2023-06-28 DIAGNOSIS — M5106 Intervertebral disc disorders with myelopathy, lumbar region: Secondary | ICD-10-CM | POA: Diagnosis present

## 2023-06-28 DIAGNOSIS — M5116 Intervertebral disc disorders with radiculopathy, lumbar region: Secondary | ICD-10-CM

## 2023-06-28 DIAGNOSIS — M48061 Spinal stenosis, lumbar region without neurogenic claudication: Secondary | ICD-10-CM | POA: Diagnosis not present

## 2023-06-28 DIAGNOSIS — Z01818 Encounter for other preprocedural examination: Secondary | ICD-10-CM

## 2023-06-28 NOTE — Telephone Encounter (Signed)
-----   Message from Morey Ar sent at 06/28/2023  3:50 PM EDT ----- Will you please make this into a telephone note and send to pre-op pool?  Thank you!  DW ----- Message ----- From: Alvira Azure, RN Sent: 06/28/2023   3:40 PM EDT To: Morey Ar, NP

## 2023-06-28 NOTE — Patient Instructions (Addendum)
 Please see below for information in regards to your upcoming surgery:   Planned surgery: L3-4 Discectomy with L4-5 hemilaminectomy    Surgery date: 07/15/2023 at [facility] (Medical Mall: [address] [place], [place] [address]) - you will find out your arrival time the business day before your surgery.   Pre-op appointment at [facility] Pre-admit Testing: you will receive a call with a date/time for this appointment. If you are scheduled for an in person appointment, Pre-admit Testing is located on the first floor of the [facility] building, [address]. During this appointment, they will advise you which medications you can take the morning of surgery, and which medications you will need to hold for surgery. Labs (such as blood work, EKG) may be done at your pre-op appointment. You are not required to fast for these labs. Should you need to change your pre-op appointment, please call Pre-admit testing at [phone].   Blood thinners:   Aspirin  81mg :  OK to stay on aspirin  81mg    Diabetes/weight loss medications: Per anesthesia guidelines (due to the increased risk of aspiration caused by delayed gastric emptying):   Metformin: hold for 2 days prior to surgery   Surgical clearance: we will send a clearance form to Aleene Hurry NP . They may wish to see you in their office prior to signing the clearance form. If so, they may call you to schedule an appointment.   Common restrictions after surgery: No bending, lifting, or twisting ("BLT"). Avoid lifting objects heavier than 10 pounds for the first 6 weeks after surgery. Where possible, avoid household activities that involve lifting, bending, reaching, pushing, or pulling such as laundry, vacuuming, grocery shopping, and childcare. Try to arrange for help from friends and family for these activities while you heal. Do not drive while taking prescription pain medication. Weeks 6  through 12 after surgery: avoid lifting more than 25 pounds.    How to contact us :  If you have any questions/concerns before or after surgery, you can reach us  at [phone], or you can send a mychart message. We can be reached by phone or mychart 8am-4pm, Monday-Friday.  *Please note: Calls after 4pm are forwarded to a third party answering service. Mychart messages are not routinely monitored during evenings, weekends, and holidays. Please call our office to contact the answering service for urgent concerns during non-business hours.    If you have FMLA/disability paperwork, please drop it off or fax it to [phone], attention Patty.   Appointments/FMLA & disability paperwork: Gerlean Kocher, & Maryann Smalls Registered Nurses/Surgery schedulers: Kendelyn & Allyanna Appleman Medical Assistants: Donnajean Fuse Physician Assistants: Ludwig Safer, PA-C, Anastacio Karvonen, PA-C & Lucetta Russel, PA-C Surgeons: Jodeen Munch, MD & Henderson Lock, MD   [facility] PREADMIT TESTING VISIT and SURGERY INFORMATION SHEET   Now that surgery has been scheduled you can anticipate several phone calls from [facility] services. A pharmacy technician will call you to verify your current list of medications taken at home.               The Pre-Service Center will call to verify your insurance information and to give you billing estimates and information.             The Preadmit Testing Office will be calling to schedule a visit to obtain information for the anesthesia team and provide instructions on preparation for surgery.  What can you expect for the Preadmit Testing Visit: Appointments may  be scheduled in-person or by telephone.  If a telephone visit is scheduled, you may be asked to come into the office to have lab tests or other studies performed.   This visit will not be completed any greater than 14 days prior to your surgery.  If your surgery has been scheduled for a future date, please do  not be alarmed if we have not contacted you to schedule an appointment more than a month prior to the surgery date.    Please be prepared to provide the following information during this appointment:            -Personal medical history                                               -Medication and allergy list            -Any history of problems with anesthesia              -Recent lab work or diagnostic studies            -Please notify us  of any needs we should be aware of to provide the best care possible           -You will be provided with instructions on how to prepare for your surgery.    On The Day of Surgery:  You must have a driver to take you home after surgery, you will be asked not to drive for 24 hours following surgery.  Taxi, [employer] and non-medical transport will not be acceptable means of transportation unless you have a responsible individual who will be traveling with you.  Visitors in the surgical area:   2 people will be able to visit you in your room once your preparation for surgery has been completed. During surgery, your visitors will be asked to wait in the Surgery Waiting Area.  It is not a requirement for them to stay, if they prefer to leave and come back.  Your visitor(s) will be given an update once the surgery has been completed.  No visitors are allowed in the initial recovery room to respect patient privacy and safety.  Once you are more awake and transfer to the secondary recovery area, or are transferred to an inpatient room, visitors will again be able to see you.  To respect and protect your privacy: We will ask on the day of surgery who your driver will be and what the contact number for that individual will be. We will ask if it is okay to share information with this individual, or if there is an alternative individual that we, or the surgeon, should contact to provide updates and information. If family or friends come to the surgical information desk  requesting information about you, who you have not listed with us , no information will be given.   It may be helpful to designate someone as the main contact who will be responsible for updating your other friends and family.    PREADMIT TESTING OFFICE: [phone] SAME DAY SURGERY: [phone] We look forward to caring for you before and throughout the process of your surgery.

## 2023-06-28 NOTE — Telephone Encounter (Signed)
   Pre-operative Risk Assessment    Patient Name: Carlos Lopez  DOB: November 29, 1960 MRN: [id number]   Date of last office visit: 01/06/23 [facility] WITTENBORN. NP Date of next office visit: 07/06/23 Morey Ar, NP   Request for Surgical Clearance    Procedure:  L3-4 Discectomy with L4-5 Hemi-laminectomy   Date of Surgery:  Clearance 07/15/23                                Surgeon: DR. Henderson Lock  Surgeon's Group or Practice Name:  [facility] Phone number:  [phone] Fax number:  [phone]   Type of Clearance Requested:   - Medical ; PER FORM OK TO STAY ON ASA   Type of Anesthesia:  General    Additional requests/questions:    Signed, Timica Marcom   06/28/2023, 3:55 PM    Message Received: Today Wittenborn, Bernardo Bridgeman, NP  P Cv Div Preop Callback Will you please make this into a telephone note and send to pre-op pool?  Thank you!  DW       Previous Messages  Letter  Carroll Clamp, MD on 06/28/2023     [facility] Neurosurgery at [facility] [address], [address] [place], [place]  [address] Phone:  [phone]   Fax:  [phone]     Kerrie Latour Birenbaum  Jul 19, 1960     06/28/2023        Faxed to:  Morey Ar NP    Surgeon: Henderson Lock, MD    Date of procedure: 07/15/2023   Planned procedure & Anesthesia Type: L3-4 Discectomy with L4-5 Hemi-laminectomy      Should you choose to see this patient in your office to provide clearance, please ask your office staff to contact the patient directly.  Please fax your evaluation and any supporting documentation as soon as completed.  Thank you! Your assistance is greatly appreciated!       Note: We have instructed patient to hold the following medications for the procedure as listed below:     Hold: Metformin 2 days prior to surgery   Ok to stay on Aspirin  81mg     Is this patient optimized to have the above procedure?    Yes or No   If NO,  please indicate further studies/evaluations recommended   Risk:     Low      Moderate    High   Remarks:            Physician Name:    Physician Signature: ________________________________Date:_________________       [facility] Neurosurgery at [facility], [phone]                              1

## 2023-06-28 NOTE — Telephone Encounter (Signed)
 The patient would like a work note keeping him out of work starting today and through his surgery recovery period. He will access the note through his mychart.

## 2023-06-29 ENCOUNTER — Telehealth: Payer: Self-pay | Admitting: Student

## 2023-06-29 NOTE — Telephone Encounter (Signed)
 PER KENDELYN JEAN, RN: Hey. I reviewed his note and AVS from yesterday. He was instructed to stay on aspirin  81mg  due to his history.  the holding metformin is anesthesia guidelines, so we don't need clearance from you guys for that. Sorry for any confusion. Thanks   we are having him stay on the aspirin  81mg . We just need cardiac clearance from you guys =)

## 2023-06-29 NOTE — Telephone Encounter (Signed)
 Duplicate request.  Please see clearance from 06/28/2023.

## 2023-06-29 NOTE — Telephone Encounter (Signed)
   Name: Carlos Lopez  DOB: [birth year]  MRN: [id number]  Primary Cardiologist: Sammy Crisp, MD  Chart reviewed as part of pre-operative protocol coverage. The patient has an upcoming visit scheduled with Dr. Morey Ar, NP on 07/06/2023 at which time clearance can be addressed in case there are any issues that would impact surgical recommendations.  L3-4 discectomy with L4-5 hemilaminectomy is not scheduled until 07/15/2023 as below. I added preop FYI to appointment note so that provider is aware to address at time of outpatient visit.  Per office protocol the cardiology provider should forward their finalized clearance decision and recommendations regarding antiplatelet therapy to the requesting party below.    I will route this message as FYI to requesting party and remove this message from the preop box as separate preop APP input not needed at this time.   Please call with any questions.  Ava Boatman, NP  06/29/2023, 11:43 AM

## 2023-06-29 NOTE — Telephone Encounter (Signed)
 Cardiology needs to give recommendations for holding ASA.  Maybe they are needing PCP to advise on Metformin     I contacted the RN for the surgeon through secure chat: you have noted need Metformin to be held, but cardiology does not do anything with the metformin for clearance. We handle the blood thinners if pt is on one. This pt is on ASA so I am thinking you need ASA to be held. Please confirm is it ASA you need held from cardiology.

## 2023-06-29 NOTE — Telephone Encounter (Signed)
   Pre-operative Risk Assessment    Patient Name: Carlos Lopez  DOB: 11/04/1960 MRN: [id number]   Date of last office visit: 01/06/23 Date of next office visit: 07/06/23   Request for Surgical Clearance    Procedure:  L3-4 Discectomy with L4-5 Hemi-laminectomy  Date of Surgery:  Clearance 07/15/23                                Surgeon:  Henderson Lock, MD Surgeon's Group or Practice Name:  [facility] Neurosurgery Phone number:  [phone] Fax number:  [phone]   Type of Clearance Requested:  Hold Metformin 2 days prior     Type of Anesthesia:  Not Indicated   Additional requests/questions:    SignedFletcher Humble   06/29/2023, 8:49 AM

## 2023-07-04 NOTE — Progress Notes (Unsigned)
 Cardiology Clinic Note   Date: 07/06/2023 ID: Donavan, Kerlin [birth year], MRN [id number]  Primary Cardiologist:  Sammy Crisp, MD  Chief Complaint   Amarrion Pastorino Stockert is a 63 y.o. male who presents to the clinic today for routine follow up and preoperative evaluation.   Patient Profile   Sincere Liuzzi Grayer is followed by Dr. Nolan Battle for the history outlined below.      Past medical history significant for: CAD. R/LHC 07/07/2018 (AS): Distal RCA #1 50%, #2 99%.  Ostial RPDA 30%.  Proximal to mid LCx 40%.  Mild to moderate aortic valve stenosis.  Right heart pressure normal.  PCI with DES 2.25 x 32 mm to distal RCA. R/LHC 08/14/2020: Mid to distal RCA 40%.  Proximal to mid LCx 80%.  Normal LVEDP.  Recommend medical management.  Symptoms felt to be related to severe aortic stenosis. Aortic valve stenosis. AVR 10/07/2020 performed at [facility]. Echo 08/04/2022: EF 60 to 65%.  No RWMA.  Mild LVH.  Normal diastolic parameters.  Mild MR.  23 mm Inspiris bioprosthetic valve present in the aortic position, mild AS, mean gradient 16 mmHg. Hypertension. Hyperlipidemia. Lipid panel 05/26/2023: LDL 93, HDL 46, TG 155, total 169. GERD.  In summary, patient was first seen by Dr. Nolan Battle on 06/18/2022 to establish care at the request of Shereen Dike, PA-C.  He has a history of CAD status post PCI as above in May 2020.  He underwent repeat R/LHC in July 2022 secondary to significant decrease in exercise tolerance with stress testing.  LHC demonstrated proximal to mid LCx 80% with recommendation to treat medically as symptoms were felt to be related to severe aortic stenosis.  He had bicuspid aortic valve with severe aortic stenosis s/p aortic valve replacement August 2022 performed at [facility].  He had previously been followed by Dr. Bary Likes.  He reported some dyspnea with bending over but not with exertion.  He was otherwise without complaint.  Echo was updated as detailed  above.   Patient was last seen in the office by me on 01/06/2023 for routine follow-up.  He was doing well at that time with no cardiac complaints.     History of Present Illness    Today, patient is pending back surgery. From a cardiac perspective he is doing well. Patient denies shortness of breath, dyspnea on exertion, lower extremity edema, orthopnea or PND. No chest pain, pressure, or tightness. No palpitations. He denies lightheadedness, dizziness, presyncope or syncope.  He works in Production designer, theatre/television/film at [employer]. He does a lot of walking for his job. He does not follow a formal exercise program but he stays active with light to moderate home tasks and yard work. He was written out of work 2 weeks ago to await surgery on 6/5.        ROS: All other systems reviewed and are otherwise negative except as noted in History of Present Illness.  EKGs/Labs Reviewed    EKG Interpretation Date/Time:  Tuesday Jul 06 2023 15:14:34 EDT Ventricular Rate:  86 PR Interval:  136 QRS Duration:  74 QT Interval:  356 QTC Calculation: 426 R Axis:   68  Text Interpretation: Normal sinus rhythm ST & T wave abnormality, consider inferolateral ischemia When compared with ECG of 06-Jan-2023 15:39, No significant change was found Confirmed by Morey Ar [phone]) on 07/06/2023 3:16:50 PM    Risk Assessment/Calculations      HYPERTENSION CONTROL Vitals:   07/06/23 1508 07/06/23 1641  BP: (!) 160/82 (!) 150/80    The patient's blood pressure is elevated above target today.  In order to address the patient's elevated BP: Blood pressure will be monitored at home to determine if medication changes need to be made.           Physical Exam    VS:  BP (!) 150/80 (BP Location: Left Arm, Patient Position: Sitting, Cuff Size: Normal)   Pulse 86   Ht 5\' 10"  (1.778 m)   Wt 186 lb (84.4 kg)   SpO2 91%   BMI 26.69 kg/m  , BMI Body mass index is 26.69 kg/m.  GEN: Well  nourished, well developed, in no acute distress. Neck: No JVD or carotid bruits. Cardiac:  RRR. 2/6 systolic murmur. No rubs or gallops.   Respiratory:  Respirations regular and unlabored. Clear to auscultation without rales, wheezing or rhonchi. GI: Soft, nontender, nondistended. Extremities: Radials/DP/PT 2+ and equal bilaterally. No clubbing or cyanosis. No edema.  Skin: Warm and dry, no rash. Neuro: Strength intact.  Assessment & Plan   CAD S/p PCI with DES to distal RCA May 2020.  Repeat R/LHC secondary to decreased exercise tolerance during stress testing revealed proximal to mid LCx 80% with recommendation to treat medically secondary to severe AS.  Patient denies chest pain, pressure or tightness. He walks a lot for his job and is active at home.  -Continue aspirin , atorvastatin .   Aortic valve stenosis S/p AVR August 2022 performed at [facility].  Echo June 2024 showed normal LV function with bioprosthetic valve in aortic position with mild AS, mean gradient 16 mmHg.  Patient denies lightheadedness, presyncope or syncope. No lower extremity edema, orthopnea or PND. 2/6 systolic murmur on exam today. - Update echo after surgery.    Hypertension BP today 160/82 on intake and 150/80 on my recheck. Patient is in quite a bit of pain today. He reports BP at home is typically normal unless he is experiencing a flare in pain. Will not make any medication changes today.  -Continue Hyzaar.   Hyperlipidemia LDL 93 April 2025, not at goal. -Continue atorvastatin .  Preoperative cardiovascular risk assessment Patient is pending L3-4 discectomy with L4-5 Hemi laminectomy with Dr. Felipe Horton on 07/15/2023.  According to the RCRI, patient has a 0.9-6% risk of MACE. Patient reports activity equivalent to 4.0 METS (walking, working in maintenance, light to moderate household tasks and yard work).  - Based on ACC/AHA guidelines, RILLEY STASH would be at acceptable risk for the planned  procedure without further cardiovascular testing.  - Patient will NOT need to undergo echo prior to surgery. This can be done for surveillance of AVR at patient's convenience after surgery.  - Metformin prescribed by a noncardiology provider therefore recommendations for holding deferred to prescribing provider. - Per request, patient will remain on aspirin  throughout perioperative period.  Disposition: Echo. Return in 6 months after surgery.          Signed, Lonell Rives. Antwan Bribiesca, DNP, NP-C

## 2023-07-06 ENCOUNTER — Ambulatory Visit: Attending: Student | Admitting: Student

## 2023-07-06 ENCOUNTER — Encounter: Payer: Self-pay | Admitting: Student

## 2023-07-06 VITALS — BP 150/80 | HR 86 | Ht 70.0 in | Wt 186.0 lb

## 2023-07-06 DIAGNOSIS — I2510 Atherosclerotic heart disease of native coronary artery without angina pectoris: Secondary | ICD-10-CM | POA: Diagnosis not present

## 2023-07-06 DIAGNOSIS — Z8679 Personal history of other diseases of the circulatory system: Secondary | ICD-10-CM | POA: Diagnosis not present

## 2023-07-06 DIAGNOSIS — Z952 Presence of prosthetic heart valve: Secondary | ICD-10-CM

## 2023-07-06 DIAGNOSIS — E785 Hyperlipidemia, unspecified: Secondary | ICD-10-CM

## 2023-07-06 DIAGNOSIS — I10 Essential (primary) hypertension: Secondary | ICD-10-CM | POA: Diagnosis not present

## 2023-07-06 DIAGNOSIS — Z01810 Encounter for preprocedural cardiovascular examination: Secondary | ICD-10-CM

## 2023-07-06 NOTE — Patient Instructions (Signed)
 Medication Instructions:  Your Physician recommend you continue on your current medication as directed.    *If you need a refill on your cardiac medications before your next appointment, please call your pharmacy*  Lab Work: None ordered at this time  If you have labs (blood work) drawn today and your tests are completely normal, you will receive your results only by: MyChart Message (if you have MyChart) OR A paper copy in the mail If you have any lab test that is abnormal or we need to change your treatment, we will call you to review the results.  Testing/Procedures: Your physician has requested that you have an echocardiogram. Echocardiography is a painless test that uses sound waves to create images of your heart. It provides your doctor with information about the size and shape of your heart and how well your heart's chambers and valves are working.   You may receive an ultrasound enhancing agent through an IV if needed to better visualize your heart during the echo. This procedure takes approximately one hour.  There are no restrictions for this procedure.  This will take place at [address] [facility] [address], [place] [address]  Please note: We ask at that you not bring children with you during ultrasound (echo/ vascular) testing. Due to room size and safety concerns, children are not allowed in the ultrasound rooms during exams. Our front office staff cannot provide observation of children in our lobby area while testing is being conducted. An adult accompanying a patient to their appointment will only be allowed in the ultrasound room at the discretion of the ultrasound technician under special circumstances. We apologize for any inconvenience.   Follow-Up: At [facility], you and your health needs are our priority.  As part of our continuing mission to provide you with exceptional heart care, our providers are all part of one team.  This team includes  your primary Cardiologist (physician) and Advanced Practice Providers or APPs (Physician Assistants and Nurse Practitioners) who all work together to provide you with the care you need, when you need it.  Your next appointment:   6 month(s)  Provider:   You may see Sammy Crisp, MD or one of the following Advanced Practice Providers on your designated Care Team:   Laneta Pintos, NP Gildardo Labrador, PA-C Varney Gentleman, PA-C Cadence [place], PA-C Ronald Cockayne, NP Morey Ar, NP    We recommend signing up for the patient portal called "MyChart".  Sign up information is provided on this After Visit Summary.  MyChart is used to connect with patients for Virtual Visits (Telemedicine).  Patients are able to view lab/test results, encounter notes, upcoming appointments, etc.  Non-urgent messages can be sent to your provider as well.   To learn more about what you can do with MyChart, go to [email or web address].

## 2023-07-07 ENCOUNTER — Encounter
Admission: RE | Admit: 2023-07-07 | Discharge: 2023-07-07 | Disposition: A | Discharge status: Home / Self Care | Source: Ambulatory Visit | Attending: Neurosurgery | Admitting: Neurosurgery

## 2023-07-07 ENCOUNTER — Other Ambulatory Visit: Payer: Self-pay

## 2023-07-07 VITALS — BP 141/99 | HR 79 | Temp 97.9°F | Resp 17 | Ht 70.0 in | Wt 182.7 lb

## 2023-07-07 DIAGNOSIS — Z01818 Encounter for other preprocedural examination: Secondary | ICD-10-CM | POA: Insufficient documentation

## 2023-07-07 DIAGNOSIS — E119 Type 2 diabetes mellitus without complications: Secondary | ICD-10-CM

## 2023-07-07 HISTORY — DX: Type 2 diabetes mellitus without complications: E11.9

## 2023-07-07 HISTORY — DX: Gastro-esophageal reflux disease without esophagitis: K21.9

## 2023-07-07 HISTORY — DX: Thoracic aortic ectasia: I77.810

## 2023-07-07 HISTORY — DX: Presence of prosthetic heart valve: Z95.2

## 2023-07-07 HISTORY — DX: Other intervertebral disc displacement, lumbar region: M51.26

## 2023-07-07 LAB — SURGICAL PCR SCREEN
MRSA, PCR: NEGATIVE
Staphylococcus aureus: NEGATIVE

## 2023-07-07 LAB — URINALYSIS, COMPLETE (UACMP) WITH MICROSCOPIC
Bacteria, UA: NONE SEEN
Bilirubin Urine: NEGATIVE
Glucose, UA: NEGATIVE mg/dL
Hgb urine dipstick: NEGATIVE
Ketones, ur: NEGATIVE mg/dL
Leukocytes,Ua: NEGATIVE
Nitrite: NEGATIVE
Protein, ur: NEGATIVE mg/dL
RBC / HPF: 0 RBC/hpf (ref 0–5)
Specific Gravity, Urine: 1.02 (ref 1.005–1.030)
pH: 5 (ref 5.0–8.0)

## 2023-07-07 LAB — TYPE AND SCREEN
ABO/RH(D): AB POS
Antibody Screen: NEGATIVE

## 2023-07-07 NOTE — Patient Instructions (Addendum)
 Your procedure is scheduled on: THURSDAY 07/15/23 Report to the Registration Desk on the 1st floor of the Medical Mall. To find out your arrival time, please call [phone] between 1PM - 3PM on: [facility] 07/14/23 If your arrival time is 6:00 am, do not arrive before that time as the Medical Mall entrance doors do not open until 6:00 am.  REMEMBER: Instructions that are not followed completely may result in serious medical risk, up to and including death; or upon the discretion of your surgeon and anesthesiologist your surgery may need to be rescheduled.  Do not eat food after midnight the night before surgery.  No gum chewing or hard candies.  You may however, drink WATER up to 2 hours before you are scheduled to arrive for your surgery. Do not drink anything within 2 hours of your scheduled arrival time.  Stop ANY OVER THE COUNTER supplements until after surgery.  You may however, continue to take Tylenol  if needed for pain up until the day of surgery.  Continue to take you ASPIRIN  81mg  until the day the day of surgery.   Stop taking METFORMIN  2 days prior to surgery.  Last dose should be Monday 07/12/23  Continue taking all other prescription medications up until the day of surgery.  ON THE DAY OF SURGERY ONLY TAKE THESE MEDICATIONS WITH SIPS OF WATER:  allopurinol (ZYLOPRIM) atorvastatin  (LIPITOR)  gabapentin (NEURONTIN)  pantoprazole (PROTONIX)   No Alcohol for 24 hours before or after surgery.  No Smoking including e-cigarettes for 24 hours before surgery.  No chewable tobacco products for at least 6 hours before surgery.  No nicotine patches on the day of surgery.  Do not use any "recreational" drugs for at least a week (preferably 2 weeks) before your surgery.  Please be advised that the combination of cocaine and anesthesia may have negative outcomes, up to and including death. If you test positive for cocaine, your surgery will be cancelled.  On the morning of  surgery brush your teeth with toothpaste and water, you may rinse your mouth with mouthwash if you wish. Do not swallow any toothpaste or mouthwash.  Use CHG Soap as directed on instruction sheet.  Do not wear jewelry, make-up, hairpins, clips or nail polish.  For welded (permanent) jewelry: bracelets, anklets, waist bands, etc.  Please have this removed prior to surgery.  If it is not removed, there is a chance that hospital personnel will need to cut it off on the day of surgery.  Do not wear lotions, powders, or perfumes.   Do not shave body hair from the neck down 48 hours before surgery.  Contact lenses, hearing aids and dentures may not be worn into surgery.  Do not bring valuables to the hospital. [facility] is not responsible for any missing/lost belongings or valuables.   Bring your C-PAP to the hospital in case you may have to spend the night.   Notify your doctor if there is any change in your medical condition (cold, fever, infection).  Wear comfortable clothing (specific to your surgery type) to the hospital.  After surgery, you can help prevent lung complications by doing breathing exercises.  Take deep breaths and cough every 1-2 hours.   If you are being discharged the day of surgery, you will not be allowed to drive home. You will need a responsible individual to drive you home and stay with you for 24 hours after surgery.   If you are taking public transportation, you will need  to have a responsible individual with you.  Please call the Pre-admissions Testing Dept. at [phone] if you have any questions about these instructions.  Surgery Visitation Policy:  Patients having surgery or a procedure may have two visitors.  Children under the age of 62 must have an adult with them who is not the patient.     Pre-operative 5 CHG Bath Instructions   You can play a key role in reducing the risk of infection after surgery. Your skin needs to be as free of  germs as possible. You can reduce the number of germs on your skin by washing with CHG (chlorhexidine gluconate) soap before surgery. CHG is an antiseptic soap that kills germs and continues to kill germs even after washing.   DO NOT use if you have an allergy to chlorhexidine/CHG or antibacterial soaps. If your skin becomes reddened or irritated, stop using the CHG and notify one of our RNs at [phone].   Please shower with the CHG soap starting 4 days before surgery using the following schedule:     Please keep in mind the following:  DO NOT shave, including legs and underarms, starting the day of your first shower.   You may shave your face at any point before/day of surgery.  Place clean sheets on your bed the day you start using CHG soap. Use a clean washcloth (not used since being washed) for each shower. DO NOT sleep with pets once you start using the CHG.   CHG Shower Instructions:  If you choose to wash your hair and private area, wash first with your normal shampoo/soap.  After you use shampoo/soap, rinse your hair and body thoroughly to remove shampoo/soap residue.  Turn the water OFF and apply about 3 tablespoons (45 ml) of CHG soap to a CLEAN washcloth.  Apply CHG soap ONLY FROM YOUR NECK DOWN TO YOUR TOES (washing for 3-5 minutes)  DO NOT use CHG soap on face, private areas, open wounds, or sores.  Pay special attention to the area where your surgery is being performed.  If you are having back surgery, having someone wash your back for you may be helpful. Wait 2 minutes after CHG soap is applied, then you may rinse off the CHG soap.  Pat dry with a clean towel  Put on clean clothes/pajamas   If you choose to wear lotion, please use ONLY the CHG-compatible lotions on the back of this paper.     Additional instructions for the day of surgery: DO NOT APPLY any lotions, deodorants, cologne, or perfumes.   Put on clean/comfortable clothes.  Brush your teeth.  Ask your  nurse before applying any prescription medications to the skin.      CHG Compatible Lotions   Aveeno Moisturizing lotion  Cetaphil Moisturizing Cream  Cetaphil Moisturizing Lotion  Clairol Herbal Essence Moisturizing Lotion, Dry Skin  Clairol Herbal Essence Moisturizing Lotion, Extra Dry Skin  Clairol Herbal Essence Moisturizing Lotion, Normal Skin  Curel Age Defying Therapeutic Moisturizing Lotion with Alpha Hydroxy  Curel Extreme Care Body Lotion  Curel Soothing Hands Moisturizing Hand Lotion  Curel Therapeutic Moisturizing Cream, Fragrance-Free  Curel Therapeutic Moisturizing Lotion, Fragrance-Free  Curel Therapeutic Moisturizing Lotion, Original Formula  Eucerin Daily Replenishing Lotion  Eucerin Dry Skin Therapy Plus Alpha Hydroxy Crme  Eucerin Dry Skin Therapy Plus Alpha Hydroxy Lotion  Eucerin Original Crme  Eucerin Original Lotion  Eucerin Plus Crme Eucerin Plus Lotion  Eucerin TriLipid Replenishing Lotion  Keri Anti-Bacterial Hand Lotion  Keri Deep Conditioning Original Lotion Dry Skin Formula Softly Scented  Keri Deep Conditioning Original Lotion, Fragrance Free Sensitive Skin Formula  Keri Lotion Fast Absorbing Fragrance Free Sensitive Skin Formula  Keri Lotion Fast Absorbing Softly Scented Dry Skin Formula  Keri Original Lotion  Keri Skin Renewal Lotion Keri Silky Smooth Lotion  Keri Silky Smooth Sensitive Skin Lotion  Nivea Body Creamy Conditioning Oil  Nivea Body Extra Enriched Teacher, adult education Moisturizing Lotion Nivea Crme  Nivea Skin Firming Lotion  NutraDerm 30 Skin Lotion  NutraDerm Skin Lotion  NutraDerm Therapeutic Skin Cream  NutraDerm Therapeutic Skin Lotion  ProShield Protective Hand Cream  Provon moisturizing lotion

## 2023-07-14 ENCOUNTER — Encounter: Payer: Self-pay | Admitting: Neurosurgery

## 2023-07-14 NOTE — Progress Notes (Signed)
 Perioperative / Anesthesia Services  Pre-Admission Testing Clinical Review / Pre-Operative Anesthesia Consult  Date: 07/14/23  PATIENT DEMOGRAPHICS: Name: Carlos Lopez DOB: [birth year] MRN:   [id number]  Note: Available PAT nursing documentation and vital signs have been reviewed. Clinical nursing staff has updated patient's PMH/PSHx, current medication list, and drug allergies/intolerances to ensure complete and comprehensive history available to assist care teams in MDM as it pertains to the aforementioned surgical procedure and anticipated anesthetic course. Extensive review of available clinical information personally performed. Branch PMH and PSHx updated with any diagnoses/procedures that  may have been inadvertently omitted during his intake with the pre-admission testing department's nursing staff.  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 male who is submitted for pre-surgical anesthesia review and clearance prior to him undergoing the above procedure. Patient has never been a smoker in the past. Pertinent PMH includes: CAD, bicuspid aortic valve with severe stenosis (s/p AVR), angina, aortic root dilatation, HTN, HLD, T2DM, GERD (on daily PPI), lumbar DDD.  Patient is followed by cardiology (End, MD). He was last seen in the cardiology clinic on 07/06/2023; notes reviewed. At the time of his clinic visit, patient doing well overall from a cardiovascular  perspective. Patient denied any chest pain, shortness of breath, PND, orthopnea, palpitations, significant peripheral edema, weakness, fatigue, vertiginous symptoms, or presyncope/syncope. Patient with a past medical history significant for cardiovascular diagnoses. Documented physical exam was grossly benign, providing no evidence of acute exacerbation and/or decompensation of the patient's known cardiovascular conditions.  Patient underwent diagnostic RIGHT/LEFT heart catheterization on 07/07/2018.  Procedure revealed multivessel CAD; 50% distal RCA-1, 99% distal RCA-2, 30% ostial RPDA, and 40% proximal to mid LCx there was mild to moderate aortic valve stenosis.  Hemodynamics: mean PA = 25 mmHg, mean PCWP = 22 mmHg, AO saturation = 98%, CO = 6.07 L/min, and CI = 3.02 L/min/m.  PCI was subsequently performed placing a 2.25 x 32 mm Synergy DES x 1 to the distal RCA lesion.  Procedure yielded excellent angiographic result and TIMI-3 flow.  Repeat diagnostic RIGHT/LEFT heart catheterization was performed on 08/14/2020 revealing multivessel CAD; 40% mid-distal RCA (proximal to previously placed stent) and 80% proximal-mid LCx.  Patient with severe aortic valve stenosis with worsening velocities to 4 m/s. Hemodynamics: mean PA = 24 mmHg, mean PCWP = 15 mmHg, AO saturation 98%, CO = 5.96 L/min, and CI 2.95 L/min/m.  The decision was made to treat patient's coronary artery disease medically.  Given worsening aortic stenosis, patient was referred to CVTS for consideration of valve replacement.  Patient underwent AVR on 09/13/2020. #23 mm Inspiris bioprosthetic tissue valve was placed.   Most recent myocardial perfusion imaging study was performed on 12/13/2020 revealing a normal left ventricular systolic function with an EF of 54%.  There were no regional wall motion abnormalities.  No artifact or left ventricular cavity size enlargement appreciated on review of imaging.  Baseline ECG showing NSR with  nonspecific ST/T wave changes. SPECT images demonstrated no evidence of stress-induced myocardial ischemia  or arrhythmia; no scintigraphic evidence of scar. TID ratio = 1.04. Study determined to be indeterminate due to baseline ECG changes.   Most recent TTE performed on 08/04/2022 revealed a normal left ventricular systolic function with an EF of 60-65%. There was mild LVH.  There were no regional wall motion abnormalities.  Left ventricular diastolic Doppler parameters were normal.  Right ventricular size and function normal with a TAPSE measuring 1.9 cm  (normal range >/= 1.6 cm).  Bioprosthetic aortic valve well-seated and functioning properly.  There was mild tenderness replacement stenosis with a mean transvalvular pressure gradient of 16 mmHg; AVA (VTI) 1.70 cm.  What there was mild mitral and tricuspid valve regurgitation. All remaining transvalvular gradients were noted to be normal providing no evidence suggestive of valvular stenosis. Aorta normal in size with no evidence of ectasia or aneurysmal dilatation.  Blood pressure elevated at 160/82 mmHg on currently prescribed ARB (valsartan) monotherapy.  Patient is on atorvastatin  for his HLD diagnosis and ASCVD prevention.  T2DM loosely controlled on currently prescribed regimen; last HgbA1c was 7.5% when checked on 05/26/2023. He does not have an OSAH diagnosis.  Patient is active per baseline. He is able to complete all of his  ADL/IADLs without cardiovascular limitation.  Per the DASI, patient is able to achieve at least 4 METS of physical activity without experiencing any significant degree of angina/anginal equivalent symptoms.  Given patient's previously demonstrated post AVR stenosis, repeat echocardiogram was ordered.  Cardiology indicated that this did not need to be performed prior to upcoming elective orthopedic surgery. No changes were made to his medication regimen during his visit with cardiology.  Patient scheduled to follow-up with  outpatient cardiology in 6 months or sooner if needed.  Carlos Lopez is scheduled for an elective LUMBAR LAMINECTOMY/DECOMPRESSION MICRODISCECTOMY 2 LEVELS (Right) on 07/15/2023 with Dr. Henderson Lock, MD.  Given patient's past medical history significant for cardiovascular diagnoses, presurgical cardiac clearance was sought by the PAT team.  Per cardiology, "according to the RCRI, patient has a 0.9-6% risk of MACE. Patient reports activity equivalent to 4.0 METS (walking, working in maintenance, light to moderate household tasks and yard work). Based on ACC/AHA guidelines, EMANUELE MCWHIRTER would be at ACCEPTABLE risk for the planned procedure without further cardiovascular testing. Patient will NOT need to undergo echo prior to surgery. This can be done for surveillance of AVR at patient's convenience after surgery".  In review of the patient's chart, it is noted that he is on daily oral antithrombotic therapy. Given that patient's past medical history is significant for cardiovascular diagnoses, including but not limited to CAD, neurosurgery has cleared patient to continue his daily low dose ASA throughout his perioperative course.  Patient has been updated on these directives from his specialty care providers by the PAT team.  Patient denies previous perioperative complications with anesthesia in the past. In review his EMR, it is noted that patient underwent a general anesthetic course at [facility] ([facility] IV) in 09/2020 without documented complications.   MOST RECENT VITAL SIGNS:    07/07/2023    9:52 AM 07/06/2023    4:41 PM 07/06/2023    3:08 PM  Vitals with BMI  Height 5\' 10"   5\' 10"   Weight 182 lbs 11 oz  186 lbs  BMI 26.21  26.69  Systolic 141 150 161  Diastolic 99 80 82  Pulse 79  86   PROVIDERS/SPECIALISTS: NOTE: Primary physician provider listed below. Patient may have been seen by APP  or partner within same practice.   PROVIDER ROLE / SPECIALTY LAST OV   Carroll Clamp, MD Neurosurgery (Surgeon) 06/28/2023  Efraim Grange, NP Primary Care Provider 05/21/2023   Sammy Crisp, MD Cardiology 07/06/2023  Earvin Goldberg, MD Physiatry 06/03/2023   ALLERGIES: No Known Allergies  CURRENT HOME MEDICATIONS: No current facility-administered medications for this encounter.    allopurinol (ZYLOPRIM) 300 MG tablet   aspirin  81 MG chewable tablet   atorvastatin  (LIPITOR) 80 MG tablet   Cholecalciferol 50 MCG (2000 UT) CAPS   Cyanocobalamin (B-12 PO)   gabapentin (NEURONTIN) 300 MG capsule   indomethacin (INDOCIN) 50 MG capsule   metFORMIN (GLUCOPHAGE-XR) 500 MG 24 hr tablet   pantoprazole (PROTONIX) 40 MG tablet   valsartan (DIOVAN) 160 MG tablet   HISTORY: Past Medical History:  Diagnosis Date   Aortic root dilation (HCC)    Aortic stenosis due to bicuspid aortic valve    a.) s/p AVR 09/13/2020   CAD (coronary artery disease)    DDD (degenerative disc disease), lumbar    GERD (gastroesophageal reflux disease)    Gout 07/07/2018   H/O aortic valve replacement 09/13/2020   a.) #23 Inspiris biomechanical tissue valve   Hyperlipidemia    Hypertension    Long-term use of aspirin  therapy    Stable angina (HCC)    Type 2 diabetes mellitus without complication (HCC)    Past Surgical History:  Procedure Laterality Date   AORTIC VALVE REPLACEMENT N/A 09/13/2020   Procedure: AORTIC VALVE REPLACEMENT; Location: [facility]; Surgeon: Johnetta Nab, MD   CORONARY STENT INTERVENTION N/A 07/07/2018   Procedure: CORONARY STENT INTERVENTION;  Surgeon: Percival Brace, MD;  Location: [facility] INVASIVE CV LAB;  Service: Cardiovascular;  Laterality: N/A;  RCA   NECK SURGERY Right    RIGHT/LEFT HEART CATH AND CORONARY ANGIOGRAPHY N/A 07/07/2018   Procedure: RIGHT/LEFT HEART CATH AND CORONARY ANGIOGRAPHY;  Surgeon: Michelle Aid, MD;  Location: [facility] INVASIVE CV LAB;  Service: Cardiovascular;  Laterality: N/A;   RIGHT/LEFT HEART CATH AND  CORONARY ANGIOGRAPHY N/A 08/14/2020   Procedure: RIGHT/LEFT HEART CATH AND CORONARY ANGIOGRAPHY;  Surgeon: Michelle Aid, MD;  Location: [facility] INVASIVE CV LAB;  Service: Cardiovascular;  Laterality: N/A;   SHOULDER SURGERY Bilateral    Family History  Problem Relation Age of Onset   Liver cancer Mother    Vascular Disease Father    Heart disease Neg Hx    Social History   Tobacco Use   Smoking status: Never   Smokeless tobacco: Former  Substance Use Topics   Alcohol use: Never   LABS:  Hospital Outpatient Visit on 07/07/2023  Component Date Value Ref Range Status   Color, Urine 07/07/2023 YELLOW (A)  YELLOW Final   APPearance 07/07/2023 CLEAR (A)  CLEAR Final   Specific Gravity, Urine 07/07/2023 1.020  1.005 - 1.030 Final   pH 07/07/2023 5.0  5.0 - 8.0 Final   Glucose, UA 07/07/2023 NEGATIVE  NEGATIVE mg/dL Final   Hgb urine dipstick 07/07/2023 NEGATIVE  NEGATIVE Final   Bilirubin Urine 07/07/2023 NEGATIVE  NEGATIVE Final   Ketones, ur 07/07/2023 NEGATIVE  NEGATIVE mg/dL Final   Protein, ur 16/11/9602 NEGATIVE  NEGATIVE mg/dL Final   Nitrite 54/10/8117 NEGATIVE  NEGATIVE Final   Leukocytes,Ua 07/07/2023 NEGATIVE  NEGATIVE Final   RBC / HPF 07/07/2023 0  0 - 5 RBC/hpf Final   WBC, UA 07/07/2023 0-5  0 - 5 WBC/hpf Final   Bacteria, UA 07/07/2023 NONE SEEN  NONE SEEN Final  Squamous Epithelial / HPF 07/07/2023 0-5  0 - 5 /HPF Final   Mucus 07/07/2023 PRESENT   Final   Performed at [facility], [address] [place], [place] [address]   MRSA, PCR 07/07/2023 NEGATIVE  NEGATIVE Final   Staphylococcus aureus 07/07/2023 NEGATIVE  NEGATIVE Final   Comment: (NOTE) The Xpert SA Assay (FDA approved for NASAL specimens in patients 22 years of age and older), is one component of a comprehensive surveillance program. It is not intended to diagnose infection nor to guide or monitor treatment. Performed at [facility], [address] [place], [place]  [address]    ABO/RH(D) 07/07/2023 AB POS   Final   Antibody Screen 07/07/2023 NEG   Final   Sample Expiration 07/07/2023 07/21/2023,2359   Final   Extend sample reason 07/07/2023    Final                   Value:NO TRANSFUSIONS OR PREGNANCY IN THE PAST 3 MONTHS Performed at [facility], [address] [place], [place] [address]     ECG: Date: 07/06/2023 Time ECG obtained: 1514 PM Rate: 86 bpm Rhythm: normal sinus Axis (leads I and aVF): normal Intervals: PR 136 ms. QRS 74 ms. QTc 426 ms. ST segment and T wave changes: Nonspecific inferolateral ST and T wave abnormalities Evidence of a possible, age undetermined, prior infarct:  No Comparison: Similar to previous tracing obtained on 01/06/2023   IMAGING / PROCEDURES: DG LUMBAR SPINE COMPLETE performed on 06/28/2023 Grade 1 anterolisthesis of L5 on standing views with 2 mm of dynamic instability on flexion and extension views. Multilevel spondylosis, disc space height loss, degenerative endplate changes throughout the lumbar spine.  MR LUMBAR SPINE W WO CONTRAST performed on 05/05/2023 Lumbar spondylosis and degenerative disc disease, causing prominent impingement at L3-4; moderate impingement at L1-2, L2-3, and L4-5; mild to moderate impingement at L5-S1; and mild impingement at T12-L1, as noted above. The degree of impingement is mostly stable although the central narrowing of the thecal sac at L1-2 is mildly worsened.  TRANSTHORACIC ECHOCARDIOGRAM performed on 08/04/2022 Left ventricular ejection fraction, by estimation, is 60 to 65%. The left ventricle has normal function. The left ventricle has no regional wall motion abnormalities. There is mild left ventricular hypertrophy. Left ventricular diastolic parameters  were normal.  Right ventricular systolic function is normal. The right ventricular size is normal.  The mitral valve is normal in structure. Mild mitral valve regurgitation. No evidence of mitral stenosis.   The aortic valve has been repaired/replaced. Aortic valve regurgitation is not visualized. Mild aortic valve stenosis. There is a 23 mm Inspiris bioprosthetic valve present in the aortic position. Procedure Date: 10/07/20. Aortic valve mean gradient measures 16.0 mmHg.  The inferior vena cava is normal in size with greater than 50% respiratory variability, suggesting right atrial pressure of 3 mmHg.   RIGHT/LEFT HEART CATHETERIZATION AND CORONARY ANGIOGRAPHY performed on 08/14/2020 Normal left ventricular systolic function with an EF of 65% Multivessel CAD 40% mid-distal RCA 80% proximal-mid LCx Hemodynamics: Mean PA = 24 mmHg Mean PCWP = 15 mmHg AO saturation = 98.9% CO = 5.96 L/min CI = 2.95 L/min/m   CT ANGIO CHEST AORTA W/CM & OR WO/CM performed on 01/23/2020 Thickened and calcified aortic valve consistent with the clinical history of bicuspid aortic valve. No evidence of aortic aneurysm or dissection. Coronary artery calcifications with a coronary stent in the right coronary artery. Nonobstructing 6 mm stone in the upper pole collecting system  of the left kidney.   IMPRESSION AND PLAN: Carlos Lopez has been referred for pre-anesthesia review and clearance prior to him undergoing the planned anesthetic and procedural courses. Available labs, pertinent testing, and imaging results were personally reviewed by me in preparation for upcoming operative/procedural course. [facility] medical record has been updated following extensive record review and patient interview with PAT staff.   This patient has been appropriately cleared by cardiology with an overall ACCEPTABLE risk of patient experiencing significant perioperative cardiovascular complications. Based on clinical review performed today (07/14/23), barring any significant acute changes in the patient's overall condition, it is anticipated that he will be able to proceed with the planned surgical intervention. Any acute  changes in clinical condition may necessitate his procedure being postponed and/or cancelled. Patient will meet with anesthesia team (MD and/or CRNA) on the day of his procedure for preoperative evaluation/assessment. Questions regarding anesthetic course will be fielded at that time.   Pre-surgical instructions were reviewed with the patient during his PAT appointment, and questions were fielded to satisfaction by PAT clinical staff. He has been instructed on which medications that he will need to hold prior to surgery, as well as the ones that have been deemed safe/appropriate to take on the day of his procedure. As part of the general education provided by PAT, patient made aware both verbally and in writing, that he would need to abstain from the use of any illegal substances during his perioperative course. He was advised that failure to follow the provided instructions could necessitate case cancellation or result in serious perioperative complications up to and including death. Patient encouraged to contact PAT and/or his surgeon's office to discuss any questions or concerns that may arise prior to surgery; verbalized understanding.   Renate Caroline, MSN, APRN, FNP-C, CEN [facility]  Perioperative Services Nurse Practitioner Phone: [phone] Fax: [phone] 07/14/23 12:27 PM  NOTE: This note has been prepared using Dragon dictation software. Despite my best ability to proofread, there is always the potential that unintentional transcriptional errors may still occur from this process.

## 2023-07-15 ENCOUNTER — Ambulatory Visit: Payer: Self-pay | Admitting: Urgent Care

## 2023-07-15 ENCOUNTER — Ambulatory Visit

## 2023-07-15 ENCOUNTER — Other Ambulatory Visit: Payer: Self-pay

## 2023-07-15 ENCOUNTER — Encounter
Admission: RE | Disposition: A | Discharge status: Home / Self Care | Payer: Self-pay | Source: Ambulatory Visit | Attending: Neurosurgery

## 2023-07-15 ENCOUNTER — Encounter: Payer: Self-pay | Admitting: Neurosurgery

## 2023-07-15 ENCOUNTER — Ambulatory Visit
Admission: RE | Admit: 2023-07-15 | Discharge: 2023-07-15 | Disposition: A | Discharge status: Home / Self Care | Source: Ambulatory Visit | Attending: Neurosurgery | Admitting: Neurosurgery

## 2023-07-15 DIAGNOSIS — Z7984 Long term (current) use of oral hypoglycemic drugs: Secondary | ICD-10-CM | POA: Diagnosis not present

## 2023-07-15 DIAGNOSIS — K219 Gastro-esophageal reflux disease without esophagitis: Secondary | ICD-10-CM | POA: Insufficient documentation

## 2023-07-15 DIAGNOSIS — M5106 Intervertebral disc disorders with myelopathy, lumbar region: Secondary | ICD-10-CM | POA: Diagnosis not present

## 2023-07-15 DIAGNOSIS — M48062 Spinal stenosis, lumbar region with neurogenic claudication: Secondary | ICD-10-CM | POA: Insufficient documentation

## 2023-07-15 DIAGNOSIS — I2510 Atherosclerotic heart disease of native coronary artery without angina pectoris: Secondary | ICD-10-CM | POA: Insufficient documentation

## 2023-07-15 DIAGNOSIS — Z87891 Personal history of nicotine dependence: Secondary | ICD-10-CM | POA: Diagnosis not present

## 2023-07-15 DIAGNOSIS — Z955 Presence of coronary angioplasty implant and graft: Secondary | ICD-10-CM | POA: Insufficient documentation

## 2023-07-15 DIAGNOSIS — E119 Type 2 diabetes mellitus without complications: Secondary | ICD-10-CM | POA: Diagnosis not present

## 2023-07-15 DIAGNOSIS — I10 Essential (primary) hypertension: Secondary | ICD-10-CM | POA: Insufficient documentation

## 2023-07-15 DIAGNOSIS — Z01818 Encounter for other preprocedural examination: Secondary | ICD-10-CM

## 2023-07-15 HISTORY — DX: Bicuspid aortic valve: Q23.81

## 2023-07-15 HISTORY — DX: Long term (current) use of aspirin: Z79.82

## 2023-07-15 HISTORY — DX: Nonrheumatic aortic (valve) stenosis: I35.0

## 2023-07-15 HISTORY — DX: Other intervertebral disc degeneration, lumbar region without mention of lumbar back pain or lower extremity pain: M51.369

## 2023-07-15 HISTORY — PX: LUMBAR LAMINECTOMY/DECOMPRESSION MICRODISCECTOMY: SHX5026

## 2023-07-15 LAB — GLUCOSE, CAPILLARY
Glucose-Capillary: 115 mg/dL — ABNORMAL HIGH (ref 70–99)
Glucose-Capillary: 141 mg/dL — ABNORMAL HIGH (ref 70–99)

## 2023-07-15 LAB — ABO/RH: ABO/RH(D): AB POS

## 2023-07-15 SURGERY — LUMBAR LAMINECTOMY/DECOMPRESSION MICRODISCECTOMY 2 LEVELS
Anesthesia: General | Laterality: Right

## 2023-07-15 MED ORDER — PHENYLEPHRINE HCL-NACL 20-0.9 MG/250ML-% IV SOLN
INTRAVENOUS | Status: DC | PRN
  Administered 2023-07-15: 25 ug/min via INTRAVENOUS

## 2023-07-15 MED ORDER — PROPOFOL 10 MG/ML IV BOLUS
INTRAVENOUS | Status: DC | PRN
  Administered 2023-07-15: 160 mg via INTRAVENOUS

## 2023-07-15 MED ORDER — SODIUM CHLORIDE 0.9 % IV SOLN
INTRAVENOUS | Status: DC
Start: 2023-07-15 — End: 2023-07-15

## 2023-07-15 MED ORDER — HYDROMORPHONE HCL 1 MG/ML IJ SOLN
INTRAMUSCULAR | Status: AC
  Filled 2023-07-15: qty 1

## 2023-07-15 MED ORDER — CEFAZOLIN IN SODIUM CHLORIDE 2-0.9 GM/100ML-% IV SOLN
2.0000 g | Freq: Once | INTRAVENOUS | Status: DC
  Filled 2023-07-15: qty 100

## 2023-07-15 MED ORDER — FENTANYL CITRATE (PF) 100 MCG/2ML IJ SOLN
INTRAMUSCULAR | Status: DC | PRN
Start: 2023-07-15 — End: 2023-07-15
  Administered 2023-07-15: 100 ug via INTRAVENOUS

## 2023-07-15 MED ORDER — SODIUM CHLORIDE (PF) 0.9 % IJ SOLN
INTRAMUSCULAR | Status: DC | PRN
  Administered 2023-07-15: 30 mL

## 2023-07-15 MED ORDER — FENTANYL CITRATE (PF) 100 MCG/2ML IJ SOLN: 25.0000 ug | INTRAMUSCULAR | Status: DC | PRN

## 2023-07-15 MED ORDER — ACETAMINOPHEN 10 MG/ML IV SOLN
INTRAVENOUS | Status: DC | PRN
  Administered 2023-07-15: 1000 mg via INTRAVENOUS

## 2023-07-15 MED ORDER — ACETAMINOPHEN 10 MG/ML IV SOLN
INTRAVENOUS | Status: AC
  Filled 2023-07-15: qty 100

## 2023-07-15 MED ORDER — 0.9 % SODIUM CHLORIDE (POUR BTL) OPTIME
TOPICAL | Status: DC | PRN
  Administered 2023-07-15: 250 mL

## 2023-07-15 MED ORDER — DEXAMETHASONE SODIUM PHOSPHATE 10 MG/ML IJ SOLN
INTRAMUSCULAR | Status: DC | PRN
  Administered 2023-07-15: 10 mg via INTRAVENOUS

## 2023-07-15 MED ORDER — EPHEDRINE SULFATE-NACL 50-0.9 MG/10ML-% IV SOSY
PREFILLED_SYRINGE | INTRAVENOUS | Status: DC | PRN
  Administered 2023-07-15: 5 mg via INTRAVENOUS

## 2023-07-15 MED ORDER — CHLORHEXIDINE GLUCONATE 0.12 % MT SOLN: 15.0000 mL | Freq: Once | OROMUCOSAL | Status: DC

## 2023-07-15 MED ORDER — ROCURONIUM BROMIDE 100 MG/10ML IV SOLN
INTRAVENOUS | Status: DC | PRN
  Administered 2023-07-15: 50 mg via INTRAVENOUS

## 2023-07-15 MED ORDER — CHLORHEXIDINE GLUCONATE 0.12 % MT SOLN
OROMUCOSAL | Status: AC
  Filled 2023-07-15: qty 15

## 2023-07-15 MED ORDER — FENTANYL CITRATE (PF) 100 MCG/2ML IJ SOLN
INTRAMUSCULAR | Status: AC
  Filled 2023-07-15: qty 2

## 2023-07-15 MED ORDER — MIDAZOLAM HCL 2 MG/2ML IJ SOLN
INTRAMUSCULAR | Status: DC | PRN
  Administered 2023-07-15: 2 mg via INTRAVENOUS

## 2023-07-15 MED ORDER — HYDROMORPHONE HCL 1 MG/ML IJ SOLN
INTRAMUSCULAR | Status: DC | PRN
  Administered 2023-07-15: 1 mg via INTRAVENOUS

## 2023-07-15 MED ORDER — ACETAMINOPHEN 10 MG/ML IV SOLN: 1000.0000 mg | Freq: Once | INTRAVENOUS | Status: DC | PRN

## 2023-07-15 MED ORDER — PHENYLEPHRINE 80 MCG/ML (10ML) SYRINGE FOR IV PUSH (FOR BLOOD PRESSURE SUPPORT)
PREFILLED_SYRINGE | INTRAVENOUS | Status: DC | PRN
  Administered 2023-07-15 (×2): 160 ug via INTRAVENOUS
  Administered 2023-07-15 (×3): 80 ug via INTRAVENOUS

## 2023-07-15 MED ORDER — MIDAZOLAM HCL 2 MG/2ML IJ SOLN
INTRAMUSCULAR | Status: AC
  Filled 2023-07-15: qty 2

## 2023-07-15 MED ORDER — LIDOCAINE HCL (CARDIAC) PF 100 MG/5ML IV SOSY
PREFILLED_SYRINGE | INTRAVENOUS | Status: DC | PRN
  Administered 2023-07-15: 50 mg via INTRAVENOUS

## 2023-07-15 MED ORDER — OXYCODONE HCL 5 MG PO TABS
5.0000 mg | ORAL_TABLET | Freq: Once | ORAL | Status: AC | PRN
  Administered 2023-07-15: 5 mg via ORAL

## 2023-07-15 MED ORDER — BUPIVACAINE-EPINEPHRINE (PF) 0.5% -1:200000 IJ SOLN
INTRAMUSCULAR | Status: DC | PRN
Start: 2023-07-15 — End: 2023-07-15
  Administered 2023-07-15: 7 mL

## 2023-07-15 MED ORDER — OXYCODONE HCL 5 MG PO TABS
ORAL_TABLET | ORAL | Status: AC
  Filled 2023-07-15: qty 1

## 2023-07-15 MED ORDER — CEFAZOLIN SODIUM-DEXTROSE 2-4 GM/100ML-% IV SOLN
2.0000 g | INTRAVENOUS | Status: AC
  Administered 2023-07-15: 2 g via INTRAVENOUS

## 2023-07-15 MED ORDER — ONDANSETRON HCL 4 MG/2ML IJ SOLN
INTRAMUSCULAR | Status: DC | PRN
  Administered 2023-07-15 (×2): 4 mg via INTRAVENOUS

## 2023-07-15 MED ORDER — PROPOFOL 10 MG/ML IV BOLUS
INTRAVENOUS | Status: AC
  Filled 2023-07-15: qty 20

## 2023-07-15 MED ORDER — OXYCODONE HCL 5 MG/5ML PO SOLN: 5.0000 mg | Freq: Once | ORAL | Status: AC | PRN

## 2023-07-15 MED ORDER — CEFAZOLIN SODIUM-DEXTROSE 2-4 GM/100ML-% IV SOLN
INTRAVENOUS | Status: AC
Start: 2023-07-15 — End: ?
  Filled 2023-07-15: qty 100

## 2023-07-15 MED ORDER — METHYLPREDNISOLONE ACETATE 40 MG/ML IJ SUSP
INTRAMUSCULAR | Status: AC
  Filled 2023-07-15: qty 1

## 2023-07-15 MED ORDER — ONDANSETRON HCL 4 MG/2ML IJ SOLN: 4.0000 mg | Freq: Once | INTRAMUSCULAR | Status: DC | PRN

## 2023-07-15 MED ORDER — CYCLOBENZAPRINE HCL 5 MG PO TABS: 5.0000 mg | ORAL_TABLET | Freq: Three times a day (TID) | ORAL | 0 refills | Status: AC | PRN

## 2023-07-15 MED ORDER — SURGIFLO WITH THROMBIN (HEMOSTATIC MATRIX KIT) OPTIME
TOPICAL | Status: DC | PRN
  Administered 2023-07-15: 1 via TOPICAL

## 2023-07-15 MED ORDER — SUGAMMADEX SODIUM 200 MG/2ML IV SOLN
INTRAVENOUS | Status: DC | PRN
  Administered 2023-07-15: 200 mg via INTRAVENOUS

## 2023-07-15 MED ORDER — GLYCOPYRROLATE 0.2 MG/ML IJ SOLN
INTRAMUSCULAR | Status: DC | PRN
  Administered 2023-07-15: .2 mg via INTRAVENOUS

## 2023-07-15 MED ORDER — OXYCODONE HCL 5 MG PO TABS: 5.0000 mg | ORAL_TABLET | ORAL | 0 refills | Status: AC | PRN

## 2023-07-15 MED ORDER — METHYLPREDNISOLONE ACETATE 40 MG/ML IJ SUSP
INTRAMUSCULAR | Status: DC | PRN
  Administered 2023-07-15: 40 mg

## 2023-07-15 MED ORDER — DOCUSATE SODIUM 100 MG PO CAPS: 100.0000 mg | ORAL_CAPSULE | Freq: Two times a day (BID) | ORAL | 0 refills | Status: AC

## 2023-07-15 MED ORDER — ORAL CARE MOUTH RINSE: 15.0000 mL | Freq: Once | OROMUCOSAL | Status: DC

## 2023-07-15 SURGICAL SUPPLY — 32 items
BASIN KIT SINGLE STR (MISCELLANEOUS) ×1 IMPLANT
BRUSH SCRUB EZ 4% CHG (MISCELLANEOUS) ×1 IMPLANT
BUR NEURO DRILL SOFT 3.0X3.8M (BURR) ×1 IMPLANT
DERMABOND ADVANCED .7 DNX12 (GAUZE/BANDAGES/DRESSINGS) ×1 IMPLANT
DRAPE C-ARM XRAY 36X54 (DRAPES) ×2 IMPLANT
DRAPE LAPAROTOMY 100X77 ABD (DRAPES) ×1 IMPLANT
DRAPE MICROSCOPE SPINE 48X150 (DRAPES) ×1 IMPLANT
DRSG OPSITE POSTOP 3X4 (GAUZE/BANDAGES/DRESSINGS) ×1 IMPLANT
DRSG TEGADERM 4X4.75 (GAUZE/BANDAGES/DRESSINGS) IMPLANT
ELECTRODE EZSTD 165MM 6.5IN (MISCELLANEOUS) ×1 IMPLANT
ELECTRODE REM PT RTRN 9FT ADLT (ELECTROSURGICAL) ×1 IMPLANT
GLOVE BIOGEL PI IND STRL 7.0 (GLOVE) ×1 IMPLANT
GLOVE BIOGEL PI IND STRL 8 (GLOVE) ×2 IMPLANT
GLOVE SURG SYN 7.0 (GLOVE) ×1 IMPLANT
GLOVE SURG SYN 7.0 PF PI (GLOVE) ×1 IMPLANT
GLOVE SURG SYN 7.5 E (GLOVE) ×1 IMPLANT
GLOVE SURG SYN 7.5 PF PI (GLOVE) ×1 IMPLANT
GOWN SRG XL LVL 3 NONREINFORCE (GOWNS) ×1 IMPLANT
GOWN STRL REUS W/ TWL LRG LVL3 (GOWN DISPOSABLE) ×1 IMPLANT
KIT WILSON FRAME (KITS) ×1 IMPLANT
KNIFE BAYONET SHORT DISCETOMY (MISCELLANEOUS) IMPLANT
NDL SAFETY ECLIPSE 18X1.5 (NEEDLE) ×1 IMPLANT
NS IRRIG 500ML POUR BTL (IV SOLUTION) ×1 IMPLANT
PACK LAMINECTOMY ARMC (PACKS) ×1 IMPLANT
PAD ARMBOARD POSITIONER FOAM (MISCELLANEOUS) ×2 IMPLANT
SURGIFLO W/THROMBIN 8M KIT (HEMOSTASIS) ×1 IMPLANT
SUT STRATA 3-0 15 PS-2 (SUTURE) ×1 IMPLANT
SUT VIC AB 0 CT1 27XCR 8 STRN (SUTURE) ×1 IMPLANT
SUT VIC AB 2-0 CT1 18 (SUTURE) ×1 IMPLANT
SYR 30ML LL (SYRINGE) ×2 IMPLANT
SYR 3ML LL SCALE MARK (SYRINGE) ×1 IMPLANT
TRAP FLUID SMOKE EVACUATOR (MISCELLANEOUS) ×1 IMPLANT

## 2023-07-15 NOTE — Anesthesia Preprocedure Evaluation (Addendum)
 Anesthesia Evaluation  Patient identified by MRN, date of birth, ID band Patient awake    Reviewed: Allergy & Precautions, NPO status , Patient's Chart, lab work & pertinent test results  History of Anesthesia Complications Negative for: history of anesthetic complications  Airway Mallampati: III  TM Distance: >3 FB Neck ROM: Full    Dental no notable dental hx. (+) Teeth Intact   Pulmonary neg pulmonary ROS, neg sleep apnea, neg COPD, Patient abstained from smoking.Not current smoker   Pulmonary exam normal breath sounds clear to auscultation       Cardiovascular Exercise Tolerance: Good METShypertension, Pt. on medications + CAD and + Cardiac Stents  (-) Past MI (-) dysrhythmias + Valvular Problems/Murmurs  Rhythm:Regular Rate:Normal - Systolic murmurs  Patient underwent diagnostic RIGHT/LEFT heart catheterization on 07/07/2018.  Procedure revealed multivessel CAD; 50% distal RCA-1, 99% distal RCA-2, 30% ostial RPDA, and 40% proximal to mid LCx there was mild to moderate aortic valve stenosis.  Hemodynamics: mean PA = 25 mmHg, mean PCWP = 22 mmHg, AO saturation = 98%, CO = 6.07 L/min, and CI = 3.02 L/min/m.  PCI was subsequently performed placing a 2.25 x 32 mm Synergy DES x 1 to the distal RCA lesion.  Procedure yielded excellent angiographic result and TIMI-3 flow.    Repeat diagnostic RIGHT/LEFT heart catheterization was performed on 08/14/2020 revealing multivessel CAD; 40% mid-distal RCA (proximal to previously placed stent) and 80% proximal-mid LCx.  Patient with severe aortic valve stenosis with worsening velocities to 4 m/s. Hemodynamics: mean PA = 24 mmHg, mean PCWP = 15 mmHg, AO saturation 98%, CO = 5.96 L/min, and CI 2.95 L/min/m.  The decision was made to treat patient's coronary artery disease medically.  Given worsening aortic stenosis, patient was referred to CVTS for consideration of valve replacement.    Patient  underwent AVR on 09/13/2020. #23 mm Inspiris bioprosthetic tissue valve was placed.     Most recent myocardial perfusion imaging study was performed on 12/13/2020 revealing a normal left ventricular systolic function with an EF of 54%.  There were no regional wall motion abnormalities.  No artifact or left ventricular cavity size enlargement appreciated on review of imaging.  Baseline ECG showing NSR with nonspecific ST/T wave changes. SPECT images demonstrated no evidence of stress-induced myocardial ischemia or arrhythmia; no scintigraphic evidence of scar. TID ratio = 1.04. Study determined to be indeterminate due to baseline ECG changes.     Most recent TTE performed on 08/04/2022 revealed a normal left ventricular systolic function with an EF of 60-65%. There was mild LVH.  There were no regional wall motion abnormalities.  Left ventricular diastolic Doppler parameters were normal.  Right ventricular size and function normal with a TAPSE measuring 1.9 cm  (normal range >/= 1.6 cm).  Bioprosthetic aortic valve well-seated and functioning properly.  There was mild tenderness replacement stenosis with a mean transvalvular pressure gradient of 16 mmHg; AVA (VTI) 1.70 cm.  What there was mild mitral and tricuspid valve regurgitation. All remaining transvalvular gradients were noted to be normal providing no evidence suggestive of valvular stenosis. Aorta normal in size with no evidence of ectasia or aneurysmal dilatation.    Neuro/Psych negative neurological ROS  negative psych ROS   GI/Hepatic ,GERD  Medicated and Controlled,,(+)     (-) substance abuse    Endo/Other  diabetes, Well Controlled, Oral Hypoglycemic Agents    Renal/GU negative Renal ROS     Musculoskeletal   Abdominal   Peds  Hematology   Anesthesia  Other Findings Past Medical History: No date: Aortic root dilation (HCC) No date: Aortic stenosis due to bicuspid aortic valve     Comment:  a.) s/p AVR 09/13/2020 No  date: CAD (coronary artery disease) No date: DDD (degenerative disc disease), lumbar No date: GERD (gastroesophageal reflux disease) 07/07/2018: Gout 09/13/2020: H/O aortic valve replacement     Comment:  a.) #23 Inspiris biomechanical tissue valve No date: Hyperlipidemia No date: Hypertension No date: Long-term use of aspirin  therapy No date: Stable angina (HCC) No date: Type 2 diabetes mellitus without complication (HCC)  Reproductive/Obstetrics                             Anesthesia Physical Anesthesia Plan  ASA: 3  Anesthesia Plan: General   Post-op Pain Management: Ofirmev  IV (intra-op)*   Induction: Intravenous  PONV Risk Score and Plan: 2 and Ondansetron , Dexamethasone, Midazolam  and Treatment may vary due to age or medical condition  Airway Management Planned: Oral ETT and Video Laryngoscope Planned  Additional Equipment: None  Intra-op Plan:   Post-operative Plan: Extubation in OR  Informed Consent: I have reviewed the patients History and Physical, chart, labs and discussed the procedure including the risks, benefits and alternatives for the proposed anesthesia with the patient or authorized representative who has indicated his/her understanding and acceptance.     Dental advisory given  Plan Discussed with: CRNA and Surgeon  Anesthesia Plan Comments: (Discussed risks of anesthesia with patient, including PONV, sore throat, lip/dental/eye damage. Rare risks discussed as well, such as cardiorespiratory and neurological sequelae, and allergic reactions. Discussed the role of CRNA in patient's perioperative care. Patient understands.)       Anesthesia Quick Evaluation

## 2023-07-15 NOTE — Op Note (Signed)
 Indications: [name] is suffering from lumbar radiculopathy. The patient tried and failed conservative management, prompting surgical intervention.  Findings: Large disc fragment causing posterior lateral compression and displacement of the traversing nerve root with severe stenosis.  Nerve root appeared to be inflamed/engorged.  Was well decompressed at the end of the discectomy.  L4-5 level demonstrated significant posterior lateral stenosis secondary to facet hypertrophy and ligamentum flavum hypertrophy.  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   Preoperative Note:   Risks of surgery discussed include: infection, bleeding, stroke, coma, death, paralysis, CSF leak, nerve/spinal cord injury, numbness, tingling, weakness, complex regional pain syndrome, recurrent stenosis and/or disc herniation, vascular injury, development of instability, neck/back pain, need for further surgery, persistent symptoms, development of deformity, and the risks of anesthesia. The patient understood these risks and agreed to proceed.  Operative Note:   1) right L 3/4 microdiscectomy 2) right L4-5 hemilaminectomy, medial facetectomy, and lateral recess decompression  The patient was then brought from the preoperative center with intravenous access established.  The patient underwent general anesthesia and endotracheal tube intubation, and was then rotated on the [place] rail top where all pressure points were appropriately padded.  The skin was then thoroughly cleansed.  Perioperative antibiotic prophylaxis was administered.  Sterile prep and drapes were then applied and a timeout was then  observed.  C-arm was brought into the field under sterile conditions, and the L3-4 disc space identified and marked with an incision on the right 1cm lateral to midline.  Once this was complete a 2 cm incision was opened with the use of a #10 blade knife.  The Metrx tubes were sequentially advanced under lateral fluoroscopy until a 18 x 60 mm Metrx tube was placed over the facet and lamina and secured to the bed.    The microscope was then sterilely brought into the field and muscle creep was hemostased with a bipolar and resected with a pituitary rongeur.  A Bovie extender was then used to expose the spinous process and lamina.  Careful attention was placed to not violate the facet capsule. A 3 mm matchstick drill bit was then used to make a hemi-laminotomy trough until the ligamentum flavum was exposed.  This was extended to the base of the spinous process.  Once this was complete and the underlying ligamentum flavum was visualized, the ligamentum was dissected with an up angle curette and resected with a #2 and #3 mm biting Kerrison.  As the ligamentum flavum was separated the traversing nerve root became prominent out the defect in the ligamentum, we carefully dissected this off and remove the ligamentum flavum.  At this point we are able to see a severely displaced traversing nerve root.  Just proximal to this and deep was a large disc herniation causing severe compression.  The laminotomy opening was also expanded in similar fashion and hemostasis was obtained with Surgifoam and a patty as well as bone wax.  The rostral aspect of the caudal level of the lamina was also resected with a #2 biting Kerrison effort to further enhance exposure. Once this was identified a nerve root retractor suction was used to mobilize this medially.  At this point we were able to remove multiple large fragments of herniated disc material.  As we remove the material the nerve root relax back into its natural position.  By the  end of the decompression it was well decompressed and you could see flow pulsations within the dura.Once the thecal sac and nerve root were noted to be relaxed and under less tension the ball-tipped feeler was passed along the foramen distally to ensure no residual compression was noted.    Depo-Medrol was placed along the nerve root.  The area was irrigated.   The tube system was then placed at the L4-5 level with fluoroscopy.  We started again in the same fashion performing a medial facetectomy inferior medial laminotomy with a high-speed drill.  This was removed in order to expose the ligamentum flavum as it inserted on the underside of the lamina of L4 and the top of the lamina of L5.  We have were able to dissected this free and continued to release it on the medial edge all the way down to the L5 lamina.  We are able to remove this with a Kerrison rongeur, it was severely compressive.  At the end of the decompression the thecal sac was able to expand and was no longer being compressed at this site.  Ball-tipped probe was passed proximally and distally to evaluate for any further stenosis.  None was noted.  Depo-Medrol was placed on this nerve after meticulous hemostasis.  The tube system was then removed under microscopic visualization and hemostasis was obtained with a bipolar.    The fascial layer was reapproximated with the use of a 0- Vicryl suture.  Subcutaneous tissue layer was reapproximated using 2-0 Vicryl suture.  3-0 monocryl was used on the skin. The skin was then cleansed and Dermabond was used to close the skin opening.  Patient was then rotated back to the preoperative bed awakened from anesthesia and taken to recovery all counts are correct in this case.   I performed this procedure without an Designer, television/film set.   Carroll Clamp, MD [facility] Neurosurgery

## 2023-07-15 NOTE — Anesthesia Procedure Notes (Signed)
 Procedure Name: Intubation Date/Time: 07/15/2023 11:43 AM  Performed by: Niki Barter, CRNAPre-anesthesia Checklist: Patient identified, Emergency Drugs available, Suction available and Patient being monitored Patient Re-evaluated:Patient Re-evaluated prior to induction Oxygen Delivery Method: Circle system utilized Preoxygenation: Pre-oxygenation with 100% oxygen Induction Type: IV induction Ventilation: Mask ventilation without difficulty Laryngoscope Size: McGrath and 3 Grade View: Grade I Tube type: Oral Tube size: 7.0 mm Number of attempts: 1 Airway Equipment and Method: Stylet, Oral airway and Video-laryngoscopy Placement Confirmation: ETT inserted through vocal cords under direct vision, positive ETCO2 and breath sounds checked- equal and bilateral Secured at: 22 cm Tube secured with: Tape Dental Injury: Teeth and Oropharynx as per pre-operative assessment

## 2023-07-15 NOTE — Anesthesia Postprocedure Evaluation (Signed)
 Anesthesia Post Note  Patient: Carlos Lopez  Procedure(s) Performed: LUMBAR LAMINECTOMY/DECOMPRESSION MICRODISCECTOMY 2 LEVELS (Right)  Patient location during evaluation: PACU Anesthesia Type: General Level of consciousness: awake and alert Pain management: pain level controlled Vital Signs Assessment: post-procedure vital signs reviewed and stable Respiratory status: spontaneous breathing, nonlabored ventilation, respiratory function stable and patient connected to nasal cannula oxygen Cardiovascular status: blood pressure returned to baseline and stable Postop Assessment: no apparent nausea or vomiting Anesthetic complications: no   No notable events documented.   Last Vitals:  Vitals:   07/15/23 0948 07/15/23 1330  BP: (!) 150/91 (!) 152/90  Pulse: 74 85  Resp: 16 16  Temp:  (!) 36.4 C  SpO2: 99% 98%    Last Pain:  Vitals:   07/15/23 1349  TempSrc:   PainSc: 2                  Lattie Poli

## 2023-07-15 NOTE — Interval H&P Note (Signed)
 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  The various methods of treatment have been discussed with the patient and family. After consideration of risks, benefits and other options for treatment, the patient has consented to  Procedure(s) with comments: LUMBAR LAMINECTOMY/DECOMPRESSION MICRODISCECTOMY 2 LEVELS (Right) - L 3-4 Discectomy with L4-5 Hemi Laminectomy as a surgical intervention.  The patient's history has been reviewed, patient examined, no change in status, stable for surgery.  I have reviewed the patient's chart and labs.  Questions were answered to the patient's satisfaction.    Heart and lungs cleared  Carroll Clamp

## 2023-07-15 NOTE — Transfer of Care (Signed)
 Immediate Anesthesia Transfer of Care Note  Patient: Carlos Lopez  Procedure(s) Performed: LUMBAR LAMINECTOMY/DECOMPRESSION MICRODISCECTOMY 2 LEVELS (Right)  Patient Location: PACU  Anesthesia Type:General  Level of Consciousness: awake and oriented  Airway & Oxygen Therapy: Patient Spontanous Breathing and Patient connected to face mask oxygen  Post-op Assessment: Report given to RN and Post -op Vital signs reviewed and stable  Post vital signs: Reviewed and stable  Last Vitals:  Vitals Value Taken Time  BP 152/90 07/15/23 1330  Temp 36.4 C 07/15/23 1330  Pulse 88 07/15/23 1333  Resp 16 07/15/23 1333  SpO2 98 % 07/15/23 1333  Vitals shown include unfiled device data.  Last Pain:  Vitals:   07/15/23 1330  TempSrc:   PainSc: Asleep      Patients Stated Pain Goal: 1 (07/15/23 0948)  Complications: No notable events documented.

## 2023-07-15 NOTE — Discharge Instructions (Signed)
 Your surgeon has performed an operation on your lumbar spine (low back) to relieve pressure on one or more nerves. Many times, patients feel better immediately after surgery and can "overdo it." Even if you feel well, it is important that you follow these activity guidelines. If you do not let your back heal properly from the surgery, you can increase the chance of a disc herniation and/or return of your symptoms. The following are instructions to help in your recovery once you have been discharged from the hospital.  * It is ok to take NSAIDs after surgery.  You were given a small prescription of pain medication, while you are taking this you should also take a stool softener.  A stool softener was prescribed for you, commonly these are not covered by insurance, should this be the case you can often obtain this over-the-counter for a lower price.  Activity    No bending, lifting, or twisting ("BLT"). Avoid lifting objects heavier than 10 pounds (gallon milk jug).  Where possible, avoid household activities that involve lifting, bending, pushing, or pulling such as laundry, vacuuming, grocery shopping, and childcare. Try to arrange for help from friends and family for these activities while your back heals.  Increase physical activity slowly as tolerated.  Taking short walks is encouraged, but avoid strenuous exercise. Do not jog, run, bicycle, lift weights, or participate in any other exercises unless specifically allowed by your doctor. Avoid prolonged sitting, including car rides.  Talk to your doctor before resuming sexual activity.  You should not drive until cleared by your doctor.  Until released by your doctor, you should not return to work or school.  You should rest at home and let your body heal.   You may shower three days after your surgery.  After showering, lightly dab your incision dry. Do not take a tub bath or go swimming for 3 weeks, or until approved by your doctor at your  follow-up appointment.  If you smoke, we strongly recommend that you quit.  Smoking has been proven to interfere with normal healing in your back and will dramatically reduce the success rate of your surgery. Please contact [facility] (800-QUIT-NOW) and use the resources at www.[facility].com for assistance in stopping smoking.  Surgical Incision   If you have a dressing on your incision, you may remove it three days after your surgery. Keep your incision area clean and dry.  If you have staples or stitches on your incision, you should have a follow up scheduled for removal. If you do not have staples or stitches, you will have steri-strips (small pieces of surgical tape) or Dermabond glue. The steri-strips/glue should begin to peel away within about a week (it is fine if the steri-strips fall off before then). If the strips are still in place one week after your surgery, you may gently remove them.  Diet            You may return to your usual diet. Be sure to stay hydrated.  When to Contact us  Although your surgery and recovery will likely be uneventful, you may have some residual numbness, aches, and pains in your back and/or legs. This is normal and should improve in the next few weeks.  However, should you experience any of the following, contact us immediately: New numbness or weakness Pain that is progressively getting worse, and is not relieved by your pain medications or rest Bleeding, redness, swelling, pain, or drainage from surgical incision Chills or flu-like symptoms  Fever greater than 101.0 F (38.3 C) Problems with bowel or bladder functions Difficulty breathing or shortness of breath Warmth, tenderness, or swelling in your calf  Contact Information How to contact us:  If you have any questions/concerns before or after surgery, you can reach [place] at [phone], or you can send a mychart message. We can be reached by phone or mychart 8am-4pm, Monday-Friday.  *Please note:  Calls after 4pm are forwarded to a third party answering service. Mychart messages are not routinely monitored during evenings, weekends, and holidays. Please call our office to contact the answering service for urgent concerns during non-business hours.

## 2023-07-16 ENCOUNTER — Encounter: Payer: Self-pay | Admitting: Neurosurgery

## 2023-07-23 ENCOUNTER — Ambulatory Visit: Admit: 2023-07-23

## 2023-07-23 SURGERY — COLONOSCOPY
Anesthesia: General

## 2023-07-28 ENCOUNTER — Ambulatory Visit (INDEPENDENT_AMBULATORY_CARE_PROVIDER_SITE_OTHER): Admitting: Physician Assistant

## 2023-07-28 ENCOUNTER — Encounter: Payer: Self-pay | Admitting: Physician Assistant

## 2023-07-28 VITALS — BP 150/90 | Ht 70.0 in | Wt 182.0 lb

## 2023-07-28 DIAGNOSIS — Z09 Encounter for follow-up examination after completed treatment for conditions other than malignant neoplasm: Secondary | ICD-10-CM

## 2023-07-28 DIAGNOSIS — M5106 Intervertebral disc disorders with myelopathy, lumbar region: Secondary | ICD-10-CM

## 2023-07-28 NOTE — Progress Notes (Unsigned)
   REFERRING PHYSICIAN:  Rory Collard, Md [address] [place],  [place] [address]  DOS: ***  HISTORY OF PRESENT ILLNESS: ALY HAUSER is approximately *** status post ***. he is doing well. ***  Some zinging pain into right thigh   PHYSICAL EXAMINATION:  General: Patient is well developed, well nourished, calm, collected, and in no apparent distress.   NEUROLOGICAL:  General: In no acute distress.   Awake, alert, oriented to person, place, and time.  Pupils equal round and reactive to light.  Facial tone is symmetric.     Strength:            Side Iliopsoas Quads Hamstring PF DF EHL  R 5 5 5 5 5 5   L 5 5 5 5 5 5    Incision c/d/i   ROS (Neurologic):  Negative except as noted above  IMAGING: ***  ASSESSMENT/PLAN:  ANDDY WINGERT is doing well approximately *** after ***. he will follow up ***  ***  I have advised the patient to lift up to 10 pounds until 6 weeks after surgery, then increase up to 25 pounds until 12 weeks after surgery.  After 12 weeks post-op, the patient advised to increase activity as tolerated.  Advised to contact the office if any questions or concerns arise.  Ludwig Safer PA-C Department of neurosurgery

## 2023-08-05 ENCOUNTER — Telehealth: Payer: Self-pay | Admitting: Neurosurgery

## 2023-08-05 NOTE — Telephone Encounter (Signed)
 I spoke with the patient. He reports that a couple days after he saw Brooke, the pain in his right leg became more frequent.    He hadn't taken any oxycodone  and flexeril  for a couple of weeks until last night. But the pain was bad enough that he took one of each last night. It eased off this morning, but it is not totally gone. He doesn't feel it when he is sitting, but does when he is standing. It is in his right butt cheek and thigh. It doesn't go all the way down to his ankle like it did before.   He stopped gabapentin about a month ago because it wasn't helping.  He reports he has been walking some, but not excessively and he has been following his post-op restrictions. He denies new weakness, numbness in his private areas, or bowel/bladder issues.

## 2023-08-05 NOTE — Telephone Encounter (Signed)
 Per discussion with Lyle Decamp, PA-C, he should take tylenol  and ibuprofen and the cyclobenzaprine  and oxycodone  as needed. We will reserve a steroid taper for if things flare up and aren't resolving. She also mentioned that he can try gabapentin again if he wants to. I relayed the above to the patient. He will contact us  if he needs refills or if his symptoms worsen.

## 2023-08-05 NOTE — Telephone Encounter (Signed)
 Patient states he is continuing to have pain down his right leg. He had surgery on June 5th and was last seen on 6.18.25. he did mention when he came into his appointment this was discussed but at the time it was not as frequent as it is today. Wanting to know if this is normal or what he should do?

## 2023-08-23 ENCOUNTER — Encounter: Payer: Self-pay | Admitting: Neurosurgery

## 2023-08-23 ENCOUNTER — Ambulatory Visit (INDEPENDENT_AMBULATORY_CARE_PROVIDER_SITE_OTHER): Admitting: Neurosurgery

## 2023-08-23 VITALS — BP 138/88 | Temp 98.2°F | Ht 70.0 in | Wt 182.0 lb

## 2023-08-23 DIAGNOSIS — Z09 Encounter for follow-up examination after completed treatment for conditions other than malignant neoplasm: Secondary | ICD-10-CM

## 2023-08-23 DIAGNOSIS — M5106 Intervertebral disc disorders with myelopathy, lumbar region: Secondary | ICD-10-CM

## 2023-08-23 NOTE — Progress Notes (Signed)
   REFERRING PHYSICIAN:  Glover Lenis, Md [address] [place],  [place] [address]  DOS: 07/15/2023  HISTORY OF PRESENT ILLNESS: Carlos Lopez is approximately 6 weeks status post L3-4 discectomy with L4-5 hemilaminectomy.  He states that he is doing much better than he was preoperatively.  He feels like his pain is much better.  Legs are much better.  Unfortunately he did have a prolonged episode of sitting at a recent wedding and felt an exacerbation of his back pain but no recurrence of his lower extremity issues.   PHYSICAL EXAMINATION:  General: Patient is well developed, well nourished, calm, collected, and in no apparent distress.   NEUROLOGICAL:  General: In no acute distress.   Awake, alert, oriented to person, place, and time.  Pupils equal round and reactive to light.  Facial tone is symmetric.     Strength:            Side Iliopsoas Quads Hamstring PF DF EHL  R 5 5 5 5 5 5   L 5 5 5 5 5 5    Incision c/d/i   ROS (Neurologic):  Negative except as noted above  IMAGING: No new imaging prior to his appointment  ASSESSMENT/PLAN:  Carlos Lopez is doing well approximately 6 weeks following L3-4 discectomy and L4-5 hemilaminectomy.  Overall he has been doing very well has had a significant improvement in his lower extremity pain.  He does state that he gets intermittent episodes of back pain which is not unexpected given his short-term from surgery.  Both of these have been provoked.  He did extending amount of walking on the first flare, and the most recent flare was after sitting at a wedding in a uncomfortable chair for multiple hours.  He has had an improvement since that time, but was quite sore.  Will have him continue to follow-up.  I like him to stay out of work until we can have another follow-up in approximately 3 weeks to see whether or not he is improved from this most recent flare.  Once we follow-up we can come up with a return to work plan at that time.   I did let him know that is not uncommon to need to be out until 3 months after a spinal surgery.  Penne MICAEL Sharps, MD Department of neurosurgery

## 2023-08-26 ENCOUNTER — Telehealth: Payer: Self-pay | Admitting: Neurosurgery

## 2023-08-26 NOTE — Telephone Encounter (Signed)
 Patient is calling to request a work note to remain out of work through 09/13/2023. He is requesting this to be faxed to Karna Sickles at [phone].

## 2023-08-31 ENCOUNTER — Encounter: Payer: Self-pay | Admitting: Family Medicine

## 2023-08-31 NOTE — Telephone Encounter (Signed)
 Patient notified letter has been faxed to [facility].

## 2023-09-13 ENCOUNTER — Telehealth (INDEPENDENT_AMBULATORY_CARE_PROVIDER_SITE_OTHER): Admitting: Neurosurgery

## 2023-09-13 DIAGNOSIS — Z09 Encounter for follow-up examination after completed treatment for conditions other than malignant neoplasm: Secondary | ICD-10-CM

## 2023-09-13 DIAGNOSIS — M5106 Intervertebral disc disorders with myelopathy, lumbar region: Secondary | ICD-10-CM

## 2023-09-13 DIAGNOSIS — G8918 Other acute postprocedural pain: Secondary | ICD-10-CM

## 2023-09-13 NOTE — Progress Notes (Unsigned)
 Video   Conituesd short tern     Work not

## 2023-09-17 ENCOUNTER — Telehealth: Payer: Self-pay | Admitting: Neurosurgery

## 2023-09-17 NOTE — Telephone Encounter (Signed)
 Left detailed message letting patient know this information,. Ok per [employer] on file

## 2023-09-17 NOTE — Telephone Encounter (Signed)
 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

## 2023-09-17 NOTE — Telephone Encounter (Signed)
 Please see below message and advise on the work note.     Media Information  Document Information  AMB Correspondence  NEUROSURGERY ANSWERING SERVICE CALL  09/17/2023 09:23  Attached To:  Tonette HERO [employer], Provider, MD

## 2023-10-04 ENCOUNTER — Encounter: Payer: Self-pay | Admitting: Physician Assistant

## 2023-10-04 ENCOUNTER — Ambulatory Visit (INDEPENDENT_AMBULATORY_CARE_PROVIDER_SITE_OTHER): Admitting: Physician Assistant

## 2023-10-04 VITALS — BP 170/98 | Temp 98.4°F | Ht 70.0 in | Wt 188.1 lb

## 2023-10-04 DIAGNOSIS — M5106 Intervertebral disc disorders with myelopathy, lumbar region: Secondary | ICD-10-CM

## 2023-10-04 DIAGNOSIS — Z09 Encounter for follow-up examination after completed treatment for conditions other than malignant neoplasm: Secondary | ICD-10-CM

## 2023-10-04 NOTE — Progress Notes (Signed)
   REFERRING PHYSICIAN:  Glover Lenis, Md [address] [place],  [place] [address]  DOS: 07/15/2023  HISTORY OF PRESENT ILLNESS: Carlos Lopez is approximately 12 weeks status post L3-4 discectomy with L4-5 hemilaminectomy.  Overall he is doing very well and only has minimal pain.   PHYSICAL EXAMINATION:  General: Patient is well developed, well nourished, calm, collected, and in no apparent distress.   NEUROLOGICAL:  General: In no acute distress.   Awake, alert, oriented to person, place, and time.  Pupils equal round and reactive to light.  Facial tone is symmetric.     Strength:            Side Iliopsoas Quads Hamstring PF DF EHL  R 5 5 5 5 5 5   L 5 5 5 5 5 5    Incision c/d/i   ROS (Neurologic):  Negative except as noted above  IMAGING: No new imaging prior to his appointment  ASSESSMENT/PLAN:  Carlos Lopez is approximately 12 weeks status post L3-4 discectomy with L4-5 hemilaminectomy.  Overall he is doing very well and only has minimal pain. At this point in time he was instructed he can return to work without restrictions.  Lyle Decamp, PA-C Department of neurosurgery

## 2023-10-29 ENCOUNTER — Ambulatory Visit: Admitting: Anesthesiology

## 2023-10-29 ENCOUNTER — Encounter
Admission: RE | Disposition: A | Discharge status: Home / Self Care | Payer: Self-pay | Source: Home / Self Care | Attending: Gastroenterology

## 2023-10-29 ENCOUNTER — Ambulatory Visit
Admission: RE | Admit: 2023-10-29 | Discharge: 2023-10-29 | Disposition: A | Discharge status: Home / Self Care | Attending: Gastroenterology | Admitting: Gastroenterology

## 2023-10-29 ENCOUNTER — Other Ambulatory Visit: Payer: Self-pay

## 2023-10-29 DIAGNOSIS — E119 Type 2 diabetes mellitus without complications: Secondary | ICD-10-CM | POA: Diagnosis not present

## 2023-10-29 DIAGNOSIS — Z1211 Encounter for screening for malignant neoplasm of colon: Secondary | ICD-10-CM | POA: Diagnosis present

## 2023-10-29 DIAGNOSIS — I2510 Atherosclerotic heart disease of native coronary artery without angina pectoris: Secondary | ICD-10-CM | POA: Diagnosis not present

## 2023-10-29 DIAGNOSIS — E785 Hyperlipidemia, unspecified: Secondary | ICD-10-CM | POA: Diagnosis not present

## 2023-10-29 DIAGNOSIS — Z7984 Long term (current) use of oral hypoglycemic drugs: Secondary | ICD-10-CM | POA: Insufficient documentation

## 2023-10-29 DIAGNOSIS — I10 Essential (primary) hypertension: Secondary | ICD-10-CM | POA: Diagnosis not present

## 2023-10-29 DIAGNOSIS — K640 First degree hemorrhoids: Secondary | ICD-10-CM | POA: Diagnosis not present

## 2023-10-29 DIAGNOSIS — Z952 Presence of prosthetic heart valve: Secondary | ICD-10-CM | POA: Diagnosis not present

## 2023-10-29 DIAGNOSIS — K219 Gastro-esophageal reflux disease without esophagitis: Secondary | ICD-10-CM | POA: Insufficient documentation

## 2023-10-29 HISTORY — PX: COLONOSCOPY: SHX5424

## 2023-10-29 LAB — GLUCOSE, CAPILLARY: Glucose-Capillary: 98 mg/dL (ref 70–99)

## 2023-10-29 SURGERY — COLONOSCOPY
Anesthesia: General

## 2023-10-29 MED ORDER — LIDOCAINE HCL (PF) 2 % IJ SOLN
INTRAMUSCULAR | Status: AC
  Filled 2023-10-29: qty 5

## 2023-10-29 MED ORDER — PROPOFOL 10 MG/ML IV BOLUS
INTRAVENOUS | Status: DC | PRN
Start: 2023-10-29 — End: 2023-10-29
  Administered 2023-10-29 (×2): 40 mg via INTRAVENOUS

## 2023-10-29 MED ORDER — SODIUM CHLORIDE 0.9 % IV SOLN
2.0000 g | Freq: Once | INTRAVENOUS | Status: AC
  Administered 2023-10-29: 2 g via INTRAVENOUS
  Filled 2023-10-29: qty 2000

## 2023-10-29 MED ORDER — DEXMEDETOMIDINE HCL IN NACL 80 MCG/20ML IV SOLN
INTRAVENOUS | Status: AC
  Filled 2023-10-29: qty 20

## 2023-10-29 MED ORDER — PROPOFOL 500 MG/50ML IV EMUL
INTRAVENOUS | Status: DC | PRN
  Administered 2023-10-29: 75 ug/kg/min via INTRAVENOUS

## 2023-10-29 MED ORDER — DEXMEDETOMIDINE HCL IN NACL 80 MCG/20ML IV SOLN
INTRAVENOUS | Status: DC | PRN
  Administered 2023-10-29: 12 ug via INTRAVENOUS
  Administered 2023-10-29: 8 ug via INTRAVENOUS

## 2023-10-29 MED ORDER — SODIUM CHLORIDE 0.9 % IV SOLN: INTRAVENOUS | Status: DC

## 2023-10-29 MED ORDER — LIDOCAINE HCL (CARDIAC) PF 100 MG/5ML IV SOSY
PREFILLED_SYRINGE | INTRAVENOUS | Status: DC | PRN
  Administered 2023-10-29: 80 mg via INTRAVENOUS

## 2023-10-29 MED ORDER — LIDOCAINE HCL (PF) 2 % IJ SOLN
INTRAMUSCULAR | Status: AC
  Filled 2023-10-29: qty 15

## 2023-10-29 NOTE — Anesthesia Preprocedure Evaluation (Signed)
 Anesthesia Evaluation  Patient identified by MRN, date of birth, ID band Patient awake    Reviewed: Allergy & Precautions, NPO status , Patient's Chart, lab work & pertinent test results  Airway Mallampati: III  TM Distance: <3 FB Neck ROM: full    Dental  (+) Chipped   Pulmonary neg pulmonary ROS, neg shortness of breath   Pulmonary exam normal        Cardiovascular Exercise Tolerance: Good hypertension, (-) angina + CAD  Normal cardiovascular exam+ Valvular Problems/Murmurs      Neuro/Psych negative neurological ROS  negative psych ROS   GI/Hepatic Neg liver ROS,GERD  Controlled,,  Endo/Other  diabetes, Type 2    Renal/GU negative Renal ROS  negative genitourinary   Musculoskeletal   Abdominal   Peds  Hematology negative hematology ROS (+)   Anesthesia Other Findings Past Medical History: No date: Aortic root dilation (HCC) No date: Aortic stenosis due to bicuspid aortic valve     Comment:  a.) s/p AVR 09/13/2020 No date: CAD (coronary artery disease) No date: DDD (degenerative disc disease), lumbar No date: GERD (gastroesophageal reflux disease) 07/07/2018: Gout 09/13/2020: H/O aortic valve replacement     Comment:  a.) #23 Inspiris biomechanical tissue valve No date: Hyperlipidemia No date: Hypertension No date: Long-term use of aspirin  therapy No date: Stable angina (HCC) No date: Type 2 diabetes mellitus without complication (HCC)  Past Surgical History: 09/13/2020: AORTIC VALVE REPLACEMENT; N/A     Comment:  Procedure: AORTIC VALVE REPLACEMENT; Location: [facility];               Surgeon: Bernardino Lares, MD No date: COLONOSCOPY 07/07/2018: CORONARY STENT INTERVENTION; N/A     Comment:  Procedure: CORONARY STENT INTERVENTION;  Surgeon:               Ammon Blunt, MD;  Location: [facility] INVASIVE CV               LAB;  Service: Cardiovascular;  Laterality: N/A;  RCA 07/15/2023: LUMBAR  LAMINECTOMY/DECOMPRESSION MICRODISCECTOMY; Right     Comment:  Procedure: LUMBAR LAMINECTOMY/DECOMPRESSION               MICRODISCECTOMY 2 LEVELS;  Surgeon: Claudene Penne ORN, MD;              Location: [facility] ORS;  Service: Neurosurgery;  Laterality:               Right;  L 3-4 Discectomy with L4-5 Hemi Laminectomy No date: NECK SURGERY; Right 07/07/2018: RIGHT/LEFT HEART CATH AND CORONARY ANGIOGRAPHY; N/A     Comment:  Procedure: RIGHT/LEFT HEART CATH AND CORONARY               ANGIOGRAPHY;  Surgeon: Hester Wolm PARAS, MD;  Location:               [facility] INVASIVE CV LAB;  Service: Cardiovascular;                Laterality: N/A; 08/14/2020: RIGHT/LEFT HEART CATH AND CORONARY ANGIOGRAPHY; N/A     Comment:  Procedure: RIGHT/LEFT HEART CATH AND CORONARY               ANGIOGRAPHY;  Surgeon: Hester Wolm PARAS, MD;  Location:               [facility] INVASIVE CV LAB;  Service: Cardiovascular;                Laterality: N/A; No date: SHOULDER SURGERY; Bilateral  BMI  Body Mass Index: 26.37 kg/m      Reproductive/Obstetrics negative OB ROS                              Anesthesia Physical Anesthesia Plan  ASA: 3  Anesthesia Plan: General   Post-op Pain Management:    Induction: Intravenous  PONV Risk Score and Plan: Propofol  infusion and TIVA  Airway Management Planned: Natural Airway and Nasal Cannula  Additional Equipment:   Intra-op Plan:   Post-operative Plan:   Informed Consent: I have reviewed the patients History and Physical, chart, labs and discussed the procedure including the risks, benefits and alternatives for the proposed anesthesia with the patient or authorized representative who has indicated his/her understanding and acceptance.     Dental Advisory Given  Plan Discussed with: Anesthesiologist, CRNA and Surgeon  Anesthesia Plan Comments: (Patient consented for risks of anesthesia including but not limited to:  - adverse reactions to  medications - risk of airway placement if required - damage to eyes, teeth, lips or other oral mucosa - nerve damage due to positioning  - sore throat or hoarseness - Damage to heart, brain, nerves, lungs, other parts of body or loss of life  Patient voiced understanding and assent.)        Anesthesia Quick Evaluation

## 2023-10-29 NOTE — H&P (Signed)
 Outpatient short stay form Pre-procedure 10/29/2023  Carlos Lopez Schick, MD  Primary Physician: Steva Clotilda DEL, NP  Reason for visit:  Screening  History of present illness:    63 y/o gentleman with history of DM II, hld, hypertension, and history of bioprosthetic valve replacement here for screening colonoscopy. Last colonoscopy over 10 years ago was normal. No blood thinners except aspirin . No significant abdominal surgeries. No first degree relatives with GI malignancies.    Current Facility-Administered Medications:    0.9 %  sodium chloride  infusion, , Intravenous, Continuous, Graiden Henes, Carlos ONEIDA, MD, Last Rate: 20 mL/hr at 10/29/23 1058, Continued from Pre-op at 10/29/23 1058   ampicillin  (OMNIPEN) 2 g in sodium chloride  0.9 % 100 mL IVPB, 2 g, Intravenous, Once, Lacreasha Hinds, Carlos ONEIDA, MD  Medications Prior to Admission  Medication Sig Dispense Refill Last Dose/Taking   allopurinol (ZYLOPRIM) 300 MG tablet Take 300 mg by mouth in the morning.   10/29/2023 Morning   aspirin  81 MG chewable tablet Chew 1 tablet (81 mg total) by mouth daily. 90 tablet 4 10/29/2023 Morning   atorvastatin  (LIPITOR) 80 MG tablet Take 80 mg by mouth in the morning.   10/29/2023 Morning   Cholecalciferol 50 MCG (2000 UT) CAPS Take 6,000 Units by mouth in the morning.   10/29/2023 Morning   Cyanocobalamin (B-12 PO) Take 1,000 mcg by mouth in the morning.   10/29/2023 Morning   metFORMIN (GLUCOPHAGE-XR) 500 MG 24 hr tablet Take 500 mg by mouth in the morning, at noon, and at bedtime.   10/29/2023 Morning   pantoprazole (PROTONIX) 40 MG tablet Take 40 mg by mouth in the morning.   10/29/2023 Morning   valsartan (DIOVAN) 160 MG tablet Take 160 mg by mouth in the morning.   10/29/2023 Morning   indomethacin (INDOCIN) 50 MG capsule Take 50 mg by mouth 3 (three) times daily as needed (gout pain.).        No Known Allergies   Past Medical History:  Diagnosis Date   Aortic root dilation (HCC)    Aortic stenosis due  to bicuspid aortic valve    a.) s/p AVR 09/13/2020   CAD (coronary artery disease)    DDD (degenerative disc disease), lumbar    GERD (gastroesophageal reflux disease)    Gout 07/07/2018   H/O aortic valve replacement 09/13/2020   a.) #23 Inspiris biomechanical tissue valve   Hyperlipidemia    Hypertension    Long-term use of aspirin  therapy    Stable angina (HCC)    Type 2 diabetes mellitus without complication (HCC)     Review of systems:  Otherwise negative.    Physical Exam  Gen: Alert, oriented. Appears stated age.  HEENT: PERRLA. Lungs: No respiratory distress CV: RRR Abd: soft, benign, no masses Ext: No edema    Planned procedures: Proceed with colonoscopy. The patient understands the nature of the planned procedure, indications, risks, alternatives and potential complications including but not limited to bleeding, infection, perforation, damage to internal organs and possible oversedation/side effects from anesthesia. The patient agrees and gives consent to proceed.  Please refer to procedure notes for findings, recommendations and patient disposition/instructions.     Carlos Lopez Schick, MD [facility]

## 2023-10-29 NOTE — Op Note (Signed)
 [facility] Gastroenterology Patient Name: Carlos Lopez Procedure Date: 10/29/2023 11:25 AM MRN: [id number] Account #: [id number] Date of Birth: 05-21-1960 Admit Type: Outpatient Age: 63 Room: [facility] ENDO ROOM 3 Gender: Male Note Status: Finalized Instrument Name: Colon Scope [id number] Procedure:             Colonoscopy Indications:           Screening for colorectal malignant neoplasm Providers:             Ole Schick MD, MD Referring MD:          No Local Md, MD (Referring MD) Medicines:             Monitored Anesthesia Care Complications:         No immediate complications. Procedure:             Pre-Anesthesia Assessment:                        - Prior to the procedure, a History and Physical was                         performed, and patient medications and allergies were                         reviewed. The patient is competent. The risks and                         benefits of the procedure and the sedation options and                         risks were discussed with the patient. All questions                         were answered and informed consent was obtained.                         Patient identification and proposed procedure were                         verified by the physician, the nurse, the                         anesthesiologist, the anesthetist and the technician                         in the endoscopy suite. Mental Status Examination:                         alert and oriented. Airway Examination: normal                         oropharyngeal airway and neck mobility. Respiratory                         Examination: clear to auscultation. CV Examination:                         normal. Prophylactic Antibiotics: The patient requires  prophylactic antibiotics due to a prior history of                         prosthetic heart valve. Prior Anticoagulants: The                         patient has taken no  anticoagulant or antiplatelet                         agents. ASA Grade Assessment: III - A patient with                         severe systemic disease. After reviewing the risks and                         benefits, the patient was deemed in satisfactory                         condition to undergo the procedure. The anesthesia                         plan was to use monitored anesthesia care (MAC).                         Immediately prior to administration of medications,                         the patient was re-assessed for adequacy to receive                         sedatives. The heart rate, respiratory rate, oxygen                         saturations, blood pressure, adequacy of pulmonary                         ventilation, and response to care were monitored                         throughout the procedure. The physical status of the                         patient was re-assessed after the procedure.                        After obtaining informed consent, the colonoscope was                         passed under direct vision. Throughout the procedure,                         the patient's blood pressure, pulse, and oxygen                         saturations were monitored continuously. The                         Colonoscope was introduced through the anus and  advanced to the the cecum, identified by appendiceal                         orifice and ileocecal valve. The colonoscopy was                         performed without difficulty. The patient tolerated                         the procedure well. The quality of the bowel                         preparation was adequate to identify polyps. The                         ileocecal valve, appendiceal orifice, and rectum were                         photographed. Findings:      The perianal and digital rectal examinations were normal.      Internal hemorrhoids were found during retroflexion. The  hemorrhoids       were Grade I (internal hemorrhoids that do not prolapse).      The exam was otherwise without abnormality on direct and retroflexion       views. Impression:            - Internal hemorrhoids.                        - The examination was otherwise normal on direct and                         retroflexion views.                        - No specimens collected. Recommendation:        - Discharge patient to home.                        - Resume previous diet.                        - Continue present medications.                        - Repeat colonoscopy in 10 years for screening                         purposes.                        - Return to referring physician as previously                         scheduled. Procedure Code(s):     --- Professional ---                        H9878, Colorectal cancer screening; colonoscopy on                         individual not meeting criteria for high risk  Diagnosis Code(s):     --- Professional ---                        Z12.11, Encounter for screening for malignant neoplasm                         of colon                        K64.0, First degree hemorrhoids CPT copyright 2022 American [employer]. All rights reserved. The codes documented in this report are preliminary and upon coder review may  be revised to meet current compliance requirements. Ole Schick MD, MD 10/29/2023 11:49:10 AM Number of Addenda: 0 Note Initiated On: 10/29/2023 11:25 AM Scope Withdrawal Time: 0 hours 6 minutes 38 seconds  Total Procedure Duration: 0 hours 9 minutes 52 seconds  Estimated Blood Loss:  Estimated blood loss: none.      [facility]

## 2023-10-29 NOTE — Interval H&P Note (Signed)
 History and Physical Interval Note:  10/29/2023 11:20 AM  Carlos Lopez  has presented today for surgery, with the diagnosis of Colon cancer screening (Z12.11).  The various methods of treatment have been discussed with the patient and family. After consideration of risks, benefits and other options for treatment, the patient has consented to  Procedure(s): COLONOSCOPY (N/A) as a surgical intervention.  The patient's history has been reviewed, patient examined, no change in status, stable for surgery.  I have reviewed the patient's chart and labs.  Questions were answered to the patient's satisfaction.     Carlos Lopez  Ok to proceed with colonoscopy

## 2023-10-29 NOTE — Anesthesia Postprocedure Evaluation (Signed)
 Anesthesia Post Note  Patient: Carlos Lopez  Procedure(s) Performed: COLONOSCOPY  Patient location during evaluation: Endoscopy Anesthesia Type: General Level of consciousness: awake and alert Pain management: pain level controlled Vital Signs Assessment: post-procedure vital signs reviewed and stable Respiratory status: spontaneous breathing, nonlabored ventilation and respiratory function stable Cardiovascular status: blood pressure returned to baseline and stable Postop Assessment: no apparent nausea or vomiting Anesthetic complications: no   No notable events documented.   Last Vitals:  Vitals:   10/29/23 1203 10/29/23 1214  BP: 117/88 127/85  Pulse: (!) 58 (!) 58  Resp: 19 17  Temp: (!) 35.8 C   SpO2: 97% 100%    Last Pain:  Vitals:   10/29/23 1203  TempSrc:   PainSc: 0-No pain                 Fairy POUR Annina Piotrowski

## 2023-10-29 NOTE — Transfer of Care (Signed)
 Immediate Anesthesia Transfer of Care Note  Patient: Carlos Lopez  Procedure(s) Performed: COLONOSCOPY  Patient Location: PACU  Anesthesia Type:General  Level of Consciousness: sedated  Airway & Oxygen Therapy: Patient Spontanous Breathing  Post-op Assessment: Report given to RN and Post -op Vital signs reviewed and stable  Post vital signs: Reviewed and stable  Last Vitals:  Vitals Value Taken Time  BP    Temp    Pulse    Resp    SpO2      Last Pain:  Vitals:   10/29/23 1019  TempSrc: Temporal  PainSc:          Complications: No notable events documented.

## 2023-11-05 ENCOUNTER — Other Ambulatory Visit

## 2023-12-14 ENCOUNTER — Ambulatory Visit: Attending: Student

## 2023-12-14 DIAGNOSIS — Z952 Presence of prosthetic heart valve: Secondary | ICD-10-CM | POA: Diagnosis not present

## 2023-12-15 LAB — ECHOCARDIOGRAM COMPLETE
AR max vel: 1.08 cm2
AV Area VTI: 1.26 cm2
AV Area mean vel: 1.12 cm2
AV Mean grad: 22 mmHg
AV Peak grad: 42.3 mmHg
Ao pk vel: 3.25 m/s
Area-P 1/2: 3.6 cm2
S' Lateral: 2.95 cm

## 2023-12-16 ENCOUNTER — Ambulatory Visit: Payer: Self-pay | Admitting: Student

## 2023-12-20 NOTE — Progress Notes (Signed)
 Last read by Tonette CHRISTELLA Margaretha Layman at 7:39AM on 12/20/2023.

## 2023-12-20 NOTE — Progress Notes (Unsigned)
 Cardiology Office Note  Date:  12/21/2023   ID:  RENA SWEEDEN, DOB June 04, 1960, MRN [id number]  PCP:  Steva Clotilda DEL, NP   Chief Complaint  Patient presents with   6 month follow up     Discuss Echo results. Patient denies chest pain or shortness of breath.     HPI:  Carlos Lopez is a 63 y.o. male with past medical history of: Past Medical History:  Diagnosis Date   Aortic root dilation    Aortic stenosis due to bicuspid aortic valve    a.) s/p AVR 09/13/2020   CAD (coronary artery disease)    DDD (degenerative disc disease), lumbar    GERD (gastroesophageal reflux disease)    Gout 07/07/2018   H/O aortic valve replacement 09/13/2020   a.) #23 Inspiris biomechanical tissue valve   Hyperlipidemia    Hypertension    Long-term use of aspirin  therapy    Stable angina    Type 2 diabetes mellitus without complication    Who presents for f/u of of his coronary artery disease, aortic valve stenosis, status post AVR  In follow-up today he reports feeling well Does maintenance at [facility] Denies significant chest pain or shortness of breath on exertion  Echocardiogram report pulled up from November 2025 detailing normal ejection fraction, mean gradient 22 mmHg up from 16 up from 10  Prior records reviewed S/p PCI with DES to distal RCA May 2020.  Repeat echo July 2022 80% proximal to mid left circumflex disease, at the time recommendation was to treat medically, followed by Maryl at the time  Tolerating Lipitor 80 daily  Feels his blood pressure is better at home, high in the office Sometimes 130 systolic at home Concerned about his high numbers here today 140 up to 150 systolic Compliant with his Diovan 160 in the morning  EKG personally reviewed by myself on todays visit EKG Interpretation Date/Time:  Tuesday December 21 2023 16:22:37 EST Ventricular Rate:  76 PR Interval:  140 QRS Duration:  74 QT Interval:  390 QTC Calculation: 438 R  Axis:   42  Text Interpretation: Normal sinus rhythm Nonspecific T wave abnormality When compared with ECG of 06-Jul-2023 15:14, No significant change was found Confirmed by Perla Lye [phone]) on 12/21/2023 4:49:29 PM      PMH:   has a past medical history of Aortic root dilation, Aortic stenosis due to bicuspid aortic valve, CAD (coronary artery disease), DDD (degenerative disc disease), lumbar, GERD (gastroesophageal reflux disease), Gout (07/07/2018), H/O aortic valve replacement (09/13/2020), Hyperlipidemia, Hypertension, Long-term use of aspirin  therapy, Stable angina, and Type 2 diabetes mellitus without complication.   PSH:    Past Surgical History:  Procedure Laterality Date   AORTIC VALVE REPLACEMENT N/A 09/13/2020   Procedure: AORTIC VALVE REPLACEMENT; Location: [facility]; Surgeon: Bernardino Lares, MD   COLONOSCOPY     COLONOSCOPY N/A 10/29/2023   Procedure: COLONOSCOPY;  Surgeon: Maryruth Ole DASEN, MD;  Location: [facility] ENDOSCOPY;  Service: Endoscopy;  Laterality: N/A;   CORONARY STENT INTERVENTION N/A 07/07/2018   Procedure: CORONARY STENT INTERVENTION;  Surgeon: Ammon Blunt, MD;  Location: [facility] INVASIVE CV LAB;  Service: Cardiovascular;  Laterality: N/A;  RCA   LUMBAR LAMINECTOMY/DECOMPRESSION MICRODISCECTOMY Right 07/15/2023   Procedure: LUMBAR LAMINECTOMY/DECOMPRESSION MICRODISCECTOMY 2 LEVELS;  Surgeon: Claudene Penne ORN, MD;  Location: [facility] ORS;  Service: Neurosurgery;  Laterality: Right;  L 3-4 Discectomy with L4-5 Hemi Laminectomy   NECK SURGERY Right    RIGHT/LEFT HEART CATH AND CORONARY ANGIOGRAPHY N/A  07/07/2018   Procedure: RIGHT/LEFT HEART CATH AND CORONARY ANGIOGRAPHY;  Surgeon: Hester Wolm PARAS, MD;  Location: [facility] INVASIVE CV LAB;  Service: Cardiovascular;  Laterality: N/A;   RIGHT/LEFT HEART CATH AND CORONARY ANGIOGRAPHY N/A 08/14/2020   Procedure: RIGHT/LEFT HEART CATH AND CORONARY ANGIOGRAPHY;  Surgeon: Hester Wolm PARAS, MD;  Location: [facility] INVASIVE CV  LAB;  Service: Cardiovascular;  Laterality: N/A;   SHOULDER SURGERY Bilateral     Current Outpatient Medications  Medication Sig Dispense Refill   allopurinol (ZYLOPRIM) 300 MG tablet Take 300 mg by mouth in the morning.     aspirin  81 MG chewable tablet Chew 1 tablet (81 mg total) by mouth daily. 90 tablet 4   atorvastatin  (LIPITOR) 80 MG tablet Take 80 mg by mouth in the morning.     Azelastine-Fluticasone 137-50 MCG/ACT SUSP Place 1 spray into both nostrils 2 (two) times daily.     Cholecalciferol 50 MCG (2000 UT) CAPS Take 6,000 Units by mouth in the morning.     Cyanocobalamin (B-12 PO) Take 1,000 mcg by mouth in the morning.     indomethacin (INDOCIN) 50 MG capsule Take 50 mg by mouth 3 (three) times daily as needed (gout pain.).     metFORMIN (GLUCOPHAGE-XR) 500 MG 24 hr tablet Take 500 mg by mouth in the morning, at noon, and at bedtime.     pantoprazole (PROTONIX) 40 MG tablet Take 40 mg by mouth in the morning.     valsartan (DIOVAN) 160 MG tablet Take 160 mg by mouth in the morning.     No current facility-administered medications for this visit.   Allergies:   Patient has no known allergies.   Social History:  The patient  reports that he has never smoked. He has quit using smokeless tobacco. He reports that he does not drink alcohol and does not use drugs.   Family History:   family history includes Liver cancer in his mother; Vascular Disease in his father.    Review of Systems: Review of Systems  Constitutional: Negative.   HENT: Negative.    Respiratory: Negative.    Cardiovascular: Negative.   Gastrointestinal: Negative.   Musculoskeletal: Negative.   Neurological: Negative.   Psychiatric/Behavioral: Negative.    All other systems reviewed and are negative.   PHYSICAL EXAM: VS:  BP (!) 150/98 (BP Location: Left Arm, Patient Position: Sitting, Cuff Size: Normal)   Pulse 76   Ht 5' 10 (1.778 m)   Wt 192 lb 2 oz (87.1 kg)   SpO2 97%   BMI 27.57 kg/m  , BMI  Body mass index is 27.57 kg/m. GEN: Well nourished, well developed, in no acute distress HEENT: normal Neck: no JVD, carotid bruits, or masses Cardiac: RRR; no murmurs, rubs, or gallops,no edema  Respiratory:  clear to auscultation bilaterally, normal work of breathing GI: soft, nontender, nondistended, + BS MS: no deformity or atrophy Skin: warm and dry, no rash Neuro:  Strength and sensation are intact Psych: euthymic mood, full affect  Recent Labs: No results found for requested labs within last 365 days.    Lipid Panel No results found for: CHOL, HDL, LDLCALC, TRIG    Wt Readings from Last 3 Encounters:  12/21/23 192 lb 2 oz (87.1 kg)  10/29/23 183 lb 12.8 oz (83.4 kg)  10/04/23 188 lb 2 oz (85.3 kg)     ASSESSMENT AND PLAN:  Problem List Items Addressed This Visit       Cardiology Problems   Hyperlipidemia  Essential hypertension   Relevant Orders   EKG 12-Lead (Completed)   LVH (left ventricular hypertrophy) due to hypertensive disease, without heart failure   Relevant Orders   EKG 12-Lead (Completed)   CAD (coronary artery disease) - Primary   Relevant Orders   EKG 12-Lead (Completed)     Other   S/P AVR   Relevant Orders   ECHOCARDIOGRAM COMPLETE   History of aortic stenosis   Relevant Orders   EKG 12-Lead (Completed)   Coronary artery disease with stable angina Currently with no symptoms of angina. No further workup at this time. Continue current medication regimen.  Aortic valve stenosis, status post AVR bioprosthetic valve Mean gradient trending up 10, 16, most recently 22 mmHg Recommend repeat echocardiogram November 2026 Suggested he call for any worsening shortness of breath on exertion  Essential hypertension Continue Diovan 160 in the morning, consider adding extra 40 up to 80 mg in the evening with close monitoring of blood pressure at home He will play with the dosing and call us  with appropriate dose to achieve adequate blood  pressure at home -Reports his wife works at [employer], she will also help monitor her blood pressure  Hyperlipidemia Continue Lipitor 80 daily, may need to add Zetia 10 daily to achieve goal LDL less than 55     Signed, Velinda Lunger, M.D., Ph.D. [facility] [place], [place] [phone]

## 2023-12-21 ENCOUNTER — Ambulatory Visit: Attending: Cardiovascular Disease | Admitting: Cardiovascular Disease

## 2023-12-21 ENCOUNTER — Encounter: Payer: Self-pay | Admitting: Cardiovascular Disease

## 2023-12-21 VITALS — BP 150/98 | HR 76 | Ht 70.0 in | Wt 192.1 lb

## 2023-12-21 DIAGNOSIS — E782 Mixed hyperlipidemia: Secondary | ICD-10-CM

## 2023-12-21 DIAGNOSIS — I119 Hypertensive heart disease without heart failure: Secondary | ICD-10-CM

## 2023-12-21 DIAGNOSIS — I25118 Atherosclerotic heart disease of native coronary artery with other forms of angina pectoris: Secondary | ICD-10-CM | POA: Diagnosis not present

## 2023-12-21 DIAGNOSIS — Z8679 Personal history of other diseases of the circulatory system: Secondary | ICD-10-CM

## 2023-12-21 DIAGNOSIS — Z952 Presence of prosthetic heart valve: Secondary | ICD-10-CM | POA: Diagnosis not present

## 2023-12-21 DIAGNOSIS — I10 Essential (primary) hypertension: Secondary | ICD-10-CM

## 2023-12-21 NOTE — Patient Instructions (Addendum)
 Medication Instructions:   Ok to try extra 1/2 doses of valsartan in the PM  Monitor blood pressure at home  If you need a refill on your cardiac medications before your next appointment, please call your pharmacy.   Lab work: No new labs needed  Testing/Procedures:  Your physician has requested that you have an echocardiogram in Nov 2026. Echocardiography is a painless test that uses sound waves to create images of your heart. It provides your doctor with information about the size and shape of your heart and how well your heart's chambers and valves are working.   You may receive an ultrasound enhancing agent through an IV if needed to better visualize your heart during the echo. This procedure takes approximately one hour.  There are no restrictions for this procedure.  This will take place at [address] [facility] [address], [place] [address]  Please note: We ask at that you not bring children with you during ultrasound (echo/ vascular) testing. Due to room size and safety concerns, children are not allowed in the ultrasound rooms during exams. Our front office staff cannot provide observation of children in our lobby area while testing is being conducted. An adult accompanying a patient to their appointment will only be allowed in the ultrasound room at the discretion of the ultrasound technician under special circumstances. We apologize for any inconvenience.       Follow-Up: At [facility], you and your health needs are our priority.  As part of our continuing mission to provide you with exceptional heart care, we have created designated Provider Care Teams.  These Care Teams include your primary Cardiologist (physician) and Advanced Practice Providers (APPs -  Physician Assistants and Nurse Practitioners) who all work together to provide you with the care you need, when you need it.  You will need a follow up appointment in 12 months  Providers on your  designated Care Team:   Lonni Meager, NP Bernardino Bring, PA-C [place], [place]  COVID-19 Vaccine Information can be found at: [email or web address] For questions related to vaccine distribution or appointments, please email [email or web address] or call [phone].
